# Patient Record
Sex: Male | Born: 1985
Health system: Southern US, Community
[De-identification: ages and names within clinical notes are randomized; demographics above are authoritative.]

## PROBLEM LIST (undated history)

## (undated) DIAGNOSIS — T7840XA Allergy, unspecified, initial encounter: Secondary | ICD-10-CM

## (undated) HISTORY — DX: Allergy, unspecified, initial encounter: T78.40XA

---
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
## 2006-08-20 ENCOUNTER — Ambulatory Visit (HOSPITAL_COMMUNITY): Admission: RE | Admit: 2006-08-20 | Discharge: 2006-08-20 | Payer: Self-pay

## 2016-05-21 ENCOUNTER — Ambulatory Visit (INDEPENDENT_AMBULATORY_CARE_PROVIDER_SITE_OTHER): Payer: BLUE CROSS/BLUE SHIELD | Admitting: Physician Assistant

## 2016-05-21 VITALS — BP 120/80 | HR 81 | Temp 97.8°F | Resp 18 | Ht 70.0 in | Wt 249.6 lb

## 2016-05-21 DIAGNOSIS — T63514A Toxic effect of contact with stingray, undetermined, initial encounter: Secondary | ICD-10-CM | POA: Diagnosis not present

## 2016-05-21 DIAGNOSIS — R229 Localized swelling, mass and lump, unspecified: Secondary | ICD-10-CM | POA: Diagnosis not present

## 2016-05-21 DIAGNOSIS — L299 Pruritus, unspecified: Secondary | ICD-10-CM | POA: Diagnosis not present

## 2016-05-21 DIAGNOSIS — T148XXA Other injury of unspecified body region, initial encounter: Secondary | ICD-10-CM

## 2016-05-21 MED ORDER — CEPHALEXIN 500 MG PO CAPS: 500.0000 mg | ORAL_CAPSULE | Freq: Four times a day (QID) | ORAL | 0 refills | Status: DC

## 2016-05-21 MED ORDER — HYDROXYZINE HCL 10 MG PO TABS: 10.0000 mg | ORAL_TABLET | Freq: Three times a day (TID) | ORAL | 0 refills | Status: DC | PRN

## 2016-05-21 NOTE — Patient Instructions (Addendum)
If you are getting worse tomorrow call and I will add doxycycline.     IF you received an x-ray today, you will receive an invoice from [facility]. Please contact [facility] at [phone] with questions or concerns regarding your invoice.   IF you received labwork today, you will receive an invoice from [facility]. Please contact [facility] at [phone] with questions or concerns regarding your invoice.   Our billing staff will not be able to assist you with questions regarding bills from these companies.  You will be contacted with the lab results as soon as they are available. The fastest way to get your results is to activate your My Chart account. Instructions are located on the last page of this paperwork. If you have not heard from us regarding the results in 2 weeks, please contact this office.

## 2016-05-21 NOTE — Progress Notes (Signed)
05/22/2016 11:23 AM   DOB: 1985-10-20 / MRN: [id number]  SUBJECTIVE:  Terry Curtis is a 30 y.o. male presenting for a wound to the left hand that began about 2 weeks ago when he was stung by a sting ray.  He presented [facility] where he received a negative rad of the left hand, cipro, and opioid pain medication. He was compliant with ABX therapy and ran out of Cipro about two days ago.  Over the last 24 hours the hand has become diffusely swollen and tender.  He denies fever, chills, nausea.  He has no allergies to antibiotics.    He is allergic to tramadol.   He  has a past medical history of Allergy.    He  reports that he has never smoked. He has never used smokeless tobacco. He reports that he drinks alcohol. He reports that he does not use drugs. He  has no sexual activity history on file. The patient  has no past surgical history on file.  His family history includes Diabetes in his mother.  Review of Systems  Constitutional: Negative for chills and fever.  Cardiovascular: Negative for chest pain.  Gastrointestinal: Negative for nausea.  Skin: Negative for itching and rash.  Neurological: Negative for dizziness and headaches.  Psychiatric/Behavioral: Negative for depression.    The problem list and medications were reviewed and updated by myself where necessary and exist elsewhere in the encounter.   OBJECTIVE:  BP 120/80   Pulse 81   Temp 97.8 F (36.6 C) (Oral)   Resp 18   Ht 5\' 10"  (1.778 m)   Wt 249 lb 9.6 oz (113.2 kg)   SpO2 96%   BMI 35.81 kg/m   Physical Exam  Constitutional: He is oriented to person, place, and time.  Cardiovascular: Normal rate and regular rhythm.   Pulmonary/Chest: Effort normal and breath sounds normal.  Abdominal: Soft. Bowel sounds are normal.  Musculoskeletal: Normal range of motion.  Neurological: He is alert and oriented to person, place, and time. No cranial nerve deficit.  Skin: Skin  is warm and dry.     Psychiatric: He has a normal mood and affect.        No results found for this or any previous visit (from the past 72 hour(s)).  No results found.  ASSESSMENT AND PLAN  Terry Curtis was seen today for other.  Diagnoses and all orders for this visit:  Localized soft tissue swelling: This looks like a secondary bacterial infection.  Will start keflex to cover for typical staph and strep and if he continues to worsen in 24 hours will add on doxy to cover for vibrio species.  He will call tomorrow if he is not improving.  I don't think a white count will change the plan today.  -     WOUND CULTURE  Bite wound -     WOUND CULTURE -     cephALEXin (KEFLEX) 500 MG capsule; Take 1 capsule (500 mg total) by mouth 4 (four) times daily.  Itching -     hydrOXYzine (ATARAX/VISTARIL) 10 MG tablet; Take 1 tablet (10 mg total) by mouth 3 (three) times daily as needed for itching.    The patient is advised to call or return to clinic if he does not see an improvement in symptoms, or to seek the care of the closest emergency department if he worsens with the above plan.   Deliah BostonMichael Sameka Bagent, MHS, PA-C  [facility] 05/22/2016 11:23 AM

## 2016-05-22 ENCOUNTER — Telehealth: Payer: Self-pay

## 2016-05-22 NOTE — Telephone Encounter (Signed)
Pt states the hand is not any better and was told to call back today if not a change from yesterday   Best number [phone]

## 2016-05-23 NOTE — Telephone Encounter (Signed)
The patient is advised to call or return to clinic if he does not see an improvement in symptoms, or to seek the care of the closest emergency department if he worsens with the above plan.   Deliah BostonMichael Clark, MHS, PA-C [facility] 05/22/2016 11:23 AM

## 2016-05-23 NOTE — Telephone Encounter (Signed)
Hi Terry Curtis. Please call in doxycycline 100 mg bid for ten days to puts pharmacy asap. Lease call pt and advise he continue keflex and add the doxy. I need to see him back on Friday.   Deliah BostonMichael Jaycee Mckellips

## 2016-05-24 MED ORDER — DOXYCYCLINE HYCLATE 100 MG PO TABS: 100.0000 mg | ORAL_TABLET | Freq: Two times a day (BID) | ORAL | 0 refills | Status: DC

## 2016-05-24 NOTE — Telephone Encounter (Signed)
Pt advised and Rx sent in.

## 2016-05-25 ENCOUNTER — Ambulatory Visit (INDEPENDENT_AMBULATORY_CARE_PROVIDER_SITE_OTHER): Payer: BLUE CROSS/BLUE SHIELD | Admitting: Physician Assistant

## 2016-05-25 VITALS — BP 148/92 | HR 83 | Temp 98.0°F | Resp 16 | Ht 70.0 in | Wt 246.8 lb

## 2016-05-25 DIAGNOSIS — R229 Localized swelling, mass and lump, unspecified: Secondary | ICD-10-CM | POA: Diagnosis not present

## 2016-05-25 DIAGNOSIS — T63514D Toxic effect of contact with stingray, undetermined, subsequent encounter: Secondary | ICD-10-CM | POA: Diagnosis not present

## 2016-05-25 DIAGNOSIS — T148XXA Other injury of unspecified body region, initial encounter: Secondary | ICD-10-CM

## 2016-05-25 LAB — WOUND CULTURE
Gram Stain: NONE SEEN
Gram Stain: NONE SEEN
Organism ID, Bacteria: NO GROWTH

## 2016-05-25 NOTE — Patient Instructions (Signed)
     IF you received an x-ray today, you will receive an invoice from [facility]. Please contact [facility] at [phone] with questions or concerns regarding your invoice.   IF you received labwork today, you will receive an invoice from [facility]. Please contact [facility] at [phone] with questions or concerns regarding your invoice.   Our billing staff will not be able to assist you with questions regarding bills from these companies.  You will be contacted with the lab results as soon as they are available. The fastest way to get your results is to activate your My Chart account. Instructions are located on the last page of this paperwork. If you have not heard from us regarding the results in 2 weeks, please contact this office.      

## 2016-05-25 NOTE — Progress Notes (Signed)
   05/25/2016 4:14 PM   DOB: 09/01/1986 / MRN: [id number]  SUBJECTIVE:  Terry Curtis is a 30 y.o. male presenting for a recheck of a left hand wound. See my last note for past details.  He is feeling much better as of yesterday.  Taking Keflex QID without complication. He continues to have some throbbing pain about the wound.  His culture came back positive for staph, but the culture failed to grow enough bacteria for sensitivities. He denies fever, chills, nausea.    He is allergic to tramadol.   He  has a past medical history of Allergy.    He  reports that he has never smoked. He has never used smokeless tobacco. He reports that he drinks alcohol. He reports that he does not use drugs. He  has no sexual activity history on file. The patient  has no past surgical history on file.  His family history includes Diabetes in his mother.  Review of Systems  Cardiovascular: Negative for chest pain.  Gastrointestinal: Negative for nausea.  Musculoskeletal: Negative for joint pain.  Skin: Negative for rash.  Neurological: Negative for headaches.    The problem list and medications were reviewed and updated by myself where necessary and exist elsewhere in the encounter.   OBJECTIVE:  BP (!) 148/92 (BP Location: Right Arm, Patient Position: Sitting, Cuff Size: Large)   Pulse 83   Temp 98 F (36.7 C) (Oral)   Resp 16   Ht 5\' 10"  (1.778 m)   Wt 246 lb 12.8 oz (111.9 kg)   SpO2 98%   BMI 35.41 kg/m    Physical Exam  Cardiovascular: Normal rate.   Pulmonary/Chest: Effort normal.  Musculoskeletal:       Hands:       No results found for this or any previous visit (from the past 72 hour(s)).  No results found.  ASSESSMENT AND PLAN  [name] was seen today for other.  Diagnoses and all orders for this visit:  Localized soft tissue swelling: I added doxy to his regimen a few days ago because he was complaining increasing redness and pain.  He has not filled this yet.  He is  improving but I would expect his improvement to be somewhat faster.  I have advised he fill the doxy and continue on keflex.  I have advised that if he is not 70% better in the next week or so to call and I will refer him to hand.   Bite wound: See above.     The patient is advised to call or return to clinic if he does not see an improvement in symptoms, or to seek the care of the closest emergency department if he worsens with the above plan.   Deliah BostonMichael Lexx Monte, MHS, PA-C [facility] 05/25/2016 4:14 PM

## 2016-05-28 ENCOUNTER — Telehealth: Payer: Self-pay

## 2016-05-28 NOTE — Telephone Encounter (Signed)
Note faxed.

## 2016-05-28 NOTE — Telephone Encounter (Signed)
Pt forgot to get note for work on Friday.  He left work to come in at 3:30 pm   He wants us to fax the note to [phone] Attention Evelina BucyKacey Sherril.  His number if needed is [phone]

## 2016-05-30 ENCOUNTER — Telehealth: Payer: Self-pay

## 2016-05-30 ENCOUNTER — Other Ambulatory Visit: Payer: Self-pay | Admitting: Physician Assistant

## 2016-05-30 DIAGNOSIS — L089 Local infection of the skin and subcutaneous tissue, unspecified: Secondary | ICD-10-CM

## 2016-05-30 DIAGNOSIS — S60519A Abrasion of unspecified hand, initial encounter: Principal | ICD-10-CM

## 2016-05-30 NOTE — Telephone Encounter (Signed)
I need to get him into hand.  Will place a referral today and will try to get him in as quickly as possible. Please call him and let him know. I have attached Noreene LarssonJill to this and she will follow up with referrals tomorrow morning as well. Deliah BostonMichael Brizeyda Holtmeyer, MS, PA-C 5:06 PM, 05/30/2016

## 2016-05-30 NOTE — Telephone Encounter (Signed)
Patient stated his left hand is not getting better. Patient stated he have blisters on his hand. He want to know if he need to come in and be seen. Patient stated he saw Deliah BostonMichael Clark. [phone].

## 2016-05-30 NOTE — Progress Notes (Signed)
f °

## 2016-06-01 ENCOUNTER — Encounter (HOSPITAL_COMMUNITY): Payer: Self-pay

## 2016-06-01 ENCOUNTER — Emergency Department (HOSPITAL_COMMUNITY)
Admission: EM | Admit: 2016-06-01 | Discharge: 2016-06-01 | Disposition: A | Discharge status: Home / Self Care | Payer: BLUE CROSS/BLUE SHIELD | Attending: Emergency Medicine | Admitting: Emergency Medicine

## 2016-06-01 ENCOUNTER — Emergency Department (HOSPITAL_COMMUNITY): Payer: BLUE CROSS/BLUE SHIELD

## 2016-06-01 DIAGNOSIS — L0291 Cutaneous abscess, unspecified: Secondary | ICD-10-CM

## 2016-06-01 DIAGNOSIS — L03114 Cellulitis of left upper limb: Secondary | ICD-10-CM | POA: Diagnosis present

## 2016-06-01 DIAGNOSIS — L02512 Cutaneous abscess of left hand: Secondary | ICD-10-CM | POA: Diagnosis not present

## 2016-06-01 LAB — CBC WITH DIFFERENTIAL/PLATELET
BASOS ABS: 0.1 10*3/uL (ref 0.0–0.1)
BASOS PCT: 1 %
Eosinophils Absolute: 0.3 10*3/uL (ref 0.0–0.7)
Eosinophils Relative: 4 %
HEMATOCRIT: 46.9 % (ref 39.0–52.0)
HEMOGLOBIN: 16.3 g/dL (ref 13.0–17.0)
LYMPHS PCT: 36 %
Lymphs Abs: 3.5 10*3/uL (ref 0.7–4.0)
MCH: 30.1 pg (ref 26.0–34.0)
MCHC: 34.8 g/dL (ref 30.0–36.0)
MCV: 86.7 fL (ref 78.0–100.0)
MONO ABS: 1.1 10*3/uL — AB (ref 0.1–1.0)
MONOS PCT: 11 %
NEUTROS ABS: 4.7 10*3/uL (ref 1.7–7.7)
NEUTROS PCT: 48 %
Platelets: 261 10*3/uL (ref 150–400)
RBC: 5.41 MIL/uL (ref 4.22–5.81)
RDW: 12.2 % (ref 11.5–15.5)
WBC: 9.6 10*3/uL (ref 4.0–10.5)

## 2016-06-01 LAB — I-STAT CG4 LACTIC ACID, ED
LACTIC ACID, VENOUS: 0.63 mmol/L (ref 0.5–1.9)
Lactic Acid, Venous: 1.24 mmol/L (ref 0.5–1.9)

## 2016-06-01 LAB — COMPREHENSIVE METABOLIC PANEL
ALK PHOS: 76 U/L (ref 38–126)
ALT: 69 U/L — AB (ref 17–63)
ANION GAP: 7 (ref 5–15)
AST: 30 U/L (ref 15–41)
Albumin: 4.5 g/dL (ref 3.5–5.0)
BILIRUBIN TOTAL: 0.7 mg/dL (ref 0.3–1.2)
BUN: 12 mg/dL (ref 6–20)
CALCIUM: 9.9 mg/dL (ref 8.9–10.3)
CO2: 26 mmol/L (ref 22–32)
Chloride: 106 mmol/L (ref 101–111)
Creatinine, Ser: 1.12 mg/dL (ref 0.61–1.24)
GLUCOSE: 91 mg/dL (ref 65–99)
POTASSIUM: 3.6 mmol/L (ref 3.5–5.1)
Sodium: 139 mmol/L (ref 135–145)
Total Protein: 6.7 g/dL (ref 6.5–8.1)

## 2016-06-01 MED ORDER — OXYCODONE-ACETAMINOPHEN 5-325 MG PO TABS
1.0000 | ORAL_TABLET | ORAL | Status: DC | PRN
  Administered 2016-06-01: 1 via ORAL

## 2016-06-01 MED ORDER — HYDROCODONE-ACETAMINOPHEN 5-325 MG PO TABS: 1.0000 | ORAL_TABLET | Freq: Four times a day (QID) | ORAL | 0 refills | Status: DC | PRN

## 2016-06-01 MED ORDER — LIDOCAINE HCL (PF) 2 % IJ SOLN: 0.0000 mL | Freq: Once | INTRAMUSCULAR | Status: DC | PRN

## 2016-06-01 MED ORDER — OXYCODONE-ACETAMINOPHEN 5-325 MG PO TABS
ORAL_TABLET | ORAL | Status: AC
  Filled 2016-06-01: qty 1

## 2016-06-01 MED ORDER — FLUCONAZOLE 100 MG PO TABS
150.0000 mg | ORAL_TABLET | Freq: Once | ORAL | Status: AC
  Administered 2016-06-01: 150 mg via ORAL
  Filled 2016-06-01: qty 2

## 2016-06-01 MED ORDER — SODIUM BICARBONATE 4 % IV SOLN
5.0000 mL | Freq: Once | INTRAVENOUS | Status: DC
  Filled 2016-06-01: qty 5

## 2016-06-01 MED ORDER — LIDOCAINE-EPINEPHRINE 2 %-1:200000 IJ SOLN
20.0000 mL | Freq: Once | INTRAMUSCULAR | Status: AC
Start: 2016-06-01 — End: 2016-06-01
  Administered 2016-06-01: 20 mL via INTRADERMAL
  Filled 2016-06-01: qty 20

## 2016-06-01 NOTE — Consult Note (Signed)
Reason for Consult:l hand swelling and pain Referring Physician: Criag Wicklund is an 30 y.o. male.  HPI: s/p sting ray barb injury 3 weeks ago with worsening of pain and swelling despite po abx  Past Medical History:  Diagnosis Date  . Allergy     History reviewed. No pertinent surgical history.  Family History  Problem Relation Age of Onset  . Diabetes Mother     Social History:  reports that he has never smoked. He has never used smokeless tobacco. He reports that he drinks alcohol. He reports that he does not use drugs.  Allergies:  Allergies  Allergen Reactions  . Tramadol Nausea Only  . Adhesive [Tape] Rash    3X3 adhesive bandage caused rash    Medications:  Scheduled: . sodium bicarbonate  5 mL Intravenous Once    Results for orders placed or performed during the hospital encounter of 06/01/16 (from the past 48 hour(s))  Comprehensive metabolic panel     Status: Abnormal   Collection Time: 06/01/16  2:36 PM  Result Value Ref Range   Sodium 139 135 - 145 mmol/L   Potassium 3.6 3.5 - 5.1 mmol/L   Chloride 106 101 - 111 mmol/L   CO2 26 22 - 32 mmol/L   Glucose, Bld 91 65 - 99 mg/dL   BUN 12 6 - 20 mg/dL   Creatinine, Ser 1.12 0.61 - 1.24 mg/dL   Calcium 9.9 8.9 - 10.3 mg/dL   Total Protein 6.7 6.5 - 8.1 g/dL   Albumin 4.5 3.5 - 5.0 g/dL   AST 30 15 - 41 U/L   ALT 69 (H) 17 - 63 U/L   Alkaline Phosphatase 76 38 - 126 U/L   Total Bilirubin 0.7 0.3 - 1.2 mg/dL   GFR calc non Af Amer >60 >60 mL/min   GFR calc Af Amer >60 >60 mL/min    Comment: (NOTE) The eGFR has been calculated using the CKD EPI equation. This calculation has not been validated in all clinical situations. eGFR's persistently <60 mL/min signify possible Chronic Kidney Disease.    Anion gap 7 5 - 15  CBC with Differential     Status: Abnormal   Collection Time: 06/01/16  2:36 PM  Result Value Ref Range   WBC 9.6 4.0 - 10.5 K/uL   RBC 5.41 4.22 - 5.81 MIL/uL   Hemoglobin 16.3  13.0 - 17.0 g/dL   HCT 46.9 39.0 - 52.0 %   MCV 86.7 78.0 - 100.0 fL   MCH 30.1 26.0 - 34.0 pg   MCHC 34.8 30.0 - 36.0 g/dL   RDW 12.2 11.5 - 15.5 %   Platelets 261 150 - 400 K/uL   Neutrophils Relative % 48 %   Neutro Abs 4.7 1.7 - 7.7 K/uL   Lymphocytes Relative 36 %   Lymphs Abs 3.5 0.7 - 4.0 K/uL   Monocytes Relative 11 %   Monocytes Absolute 1.1 (H) 0.1 - 1.0 K/uL   Eosinophils Relative 4 %   Eosinophils Absolute 0.3 0.0 - 0.7 K/uL   Basophils Relative 1 %   Basophils Absolute 0.1 0.0 - 0.1 K/uL  I-Stat CG4 Lactic Acid, ED     Status: None   Collection Time: 06/01/16  3:18 PM  Result Value Ref Range   Lactic Acid, Venous 1.24 0.5 - 1.9 mmol/L  I-Stat CG4 Lactic Acid, ED     Status: None   Collection Time: 06/01/16  9:03 PM  Result Value Ref Range  Lactic Acid, Venous 0.63 0.5 - 1.9 mmol/L    Dg Hand Complete Left  Result Date: 06/01/2016 CLINICAL DATA:  Infection. Patient reports being stung by a sting ray 20 days prior, without improvement. Edema about the third, fourth, and fifth digits. EXAM: LEFT HAND - COMPLETE 3+ VIEW COMPARISON:  None. FINDINGS: There is no evidence of fracture or dislocation. There is no evidence of arthropathy or other focal bone abnormality. No bony destructive change. There is dorsal soft tissue edema with skin irregularity. No radiopaque foreign body. No soft tissue air. IMPRESSION: Dorsal soft tissue edema. No soft tissue air or radiopaque foreign body. No osseous abnormality to suggest osteomyelitis. Electronically Signed   By: Jeb Levering M.D.   On: 06/01/2016 22:06    Review of Systems  All other systems reviewed and are negative.  Blood pressure 126/83, pulse 60, temperature 98.3 F (36.8 C), temperature source Oral, resp. rate 16, height _0  (1.778 m), weight 109.8 kg (242 lb), SpO2 97 %. Physical Exam  Constitutional: He is oriented to person, place, and time. He appears well-developed and well-nourished.  HENT:  Head:  Normocephalic and atraumatic.  Neck: Normal range of motion.  Cardiovascular: Normal rate.   Respiratory: Effort normal.  Musculoskeletal:       Left hand: He exhibits tenderness, laceration and swelling.  Left hand dorsal swelling and induration over long metacarpal  Neurological: He is alert and oriented to person, place, and time.  Skin: Skin is warm. There is erythema.    Assessment/Plan: As above    Bedside I and D performed and wound packed with iodoform  Will see in my office this Monday for dressing change  Er staff to manage pain  Will finish current abx  Terry Curtis A 06/01/2016, 10:51 PM

## 2016-06-01 NOTE — ED Notes (Signed)
Puncture would to top of left hand. Not warm to touch. Tender to palpation.

## 2016-06-01 NOTE — ED Triage Notes (Signed)
Pt presents for evaluation of cellulitis/abcess to L hand. Pt. States was stung by stingray approx 3 weeks ago. [facility] Pt. Has completed keflex and cipro. Currently taking doxycycline. Pt. Reports Wednesday noted that area on L hand began to swell and spread. Afebrile at home.

## 2016-06-01 NOTE — ED Provider Notes (Signed)
[facility]-EMERGENCY DEPT Provider Note   CSN: [id number] Arrival date & time: 06/01/16  1424     History   Chief Complaint Chief Complaint  Patient presents with  . Cellulitis    HPI Terry Curtis is a 30 y.o. male.  HPI  Patient is a 30 year old male who presents to the emergency department with complaint of worsening infection on the top of his left hand after being stung by a stingray 20 days ago. He has taken a course of Cipro, Keflex and is currently on doxycycline. He has a managed by his PCP and was referred to a hand surgeon they did not think it was appropriate for them to be seen in their office and stated if it was worsening he would need to go to the ER. He presents to the ER with worsening local swelling with redness, pain and severe persistent itching. No relief with hydroxyzine.      Past Medical History:  Diagnosis Date  . Allergy     There are no active problems to display for this patient.   History reviewed. No pertinent surgical history.     Home Medications    Prior to Admission medications   Medication Sig Start Date End Date Taking? Authorizing Provider  doxycycline (VIBRA-TABS) 100 MG tablet Take 1 tablet (100 mg total) by mouth 2 (two) times daily. Patient taking differently: Take 100 mg by mouth 2 (two) times daily. 10 day course started 05/25/16 pm 05/24/16  Yes Ofilia NeasMichael L Clark, PA-C  fexofenadine (ALLEGRA) 180 MG tablet Take 180 mg by mouth daily.   Yes Historical Provider, MD  fluticasone (FLONASE) 50 MCG/ACT nasal spray Place 1 spray into both nostrils daily.   Yes Historical Provider, MD  hydrOXYzine (ATARAX/VISTARIL) 10 MG tablet Take 1 tablet (10 mg total) by mouth 3 (three) times daily as needed for itching. Patient taking differently: Take 10 mg by mouth at bedtime as needed for itching.  05/21/16  Yes Ofilia NeasMichael L Clark, PA-C  ibuprofen (ADVIL,MOTRIN) 200 MG tablet Take 400 mg by mouth every 6 (six) hours as needed.   Yes Historical  Provider, MD  HYDROcodone-acetaminophen (NORCO/VICODIN) 5-325 MG tablet Take 1-2 tablets by mouth every 6 (six) hours as needed for severe pain. 06/01/16   Danelle BerryLeisa Juanluis Guastella, PA-C    Family History Family History  Problem Relation Age of Onset  . Diabetes Mother     Social History Social History  Substance Use Topics  . Smoking status: Never Smoker  . Smokeless tobacco: Never Used  . Alcohol use Yes     Allergies   Tramadol and Adhesive [tape]   Review of Systems Review of Systems  All other systems reviewed and are negative.    Physical Exam Updated Vital Signs BP 126/83 (BP Location: Right Arm)   Pulse 60   Temp 98.3 F (36.8 C) (Oral)   Resp 16   Ht 5\' 10"  (1.778 m)   Wt 109.8 kg   SpO2 97%   BMI 34.72 kg/m   Physical Exam  Constitutional: He is oriented to person, place, and time. He appears well-developed and well-nourished. No distress.  HENT:  Head: Normocephalic and atraumatic.  Right Ear: External ear normal.  Left Ear: External ear normal.  Nose: Nose normal.  Mouth/Throat: Oropharynx is clear and moist.  Eyes: Conjunctivae and EOM are normal. Pupils are equal, round, and reactive to light. Right eye exhibits no discharge. Left eye exhibits no discharge. No scleral icterus.  Neck: Normal range of motion.  Neck supple.  Cardiovascular: Normal rate and regular rhythm.   Pulmonary/Chest: Effort normal and breath sounds normal. No stridor. No respiratory distress.  Musculoskeletal: Normal range of motion. He exhibits no edema.  Lymphadenopathy:    He has no cervical adenopathy.  Neurological: He is alert and oriented to person, place, and time. He exhibits normal muscle tone. Coordination normal.  Skin: Skin is warm and dry. Rash noted. He is not diaphoretic. There is erythema. No pallor.  6 cm circular area of erythema and fluctuance, ttp, on dorsum of left hand over mid to distal third and fourth metacarpals  Psychiatric: He has a normal mood and affect.  His behavior is normal. Judgment and thought content normal.  Nursing note and vitals reviewed.    ED Treatments / Results  Labs (all labs ordered are listed, but only abnormal results are displayed) Labs Reviewed  COMPREHENSIVE METABOLIC PANEL - Abnormal; Notable for the following:       Result Value   ALT 69 (*)    All other components within normal limits  CBC WITH DIFFERENTIAL/PLATELET - Abnormal; Notable for the following:    Monocytes Absolute 1.1 (*)    All other components within normal limits  I-STAT CG4 LACTIC ACID, ED  I-STAT CG4 LACTIC ACID, ED    EKG  EKG Interpretation None       Radiology Dg Hand Complete Left  Result Date: 06/01/2016 CLINICAL DATA:  Infection. Patient reports being stung by a sting ray 20 days prior, without improvement. Edema about the third, fourth, and fifth digits. EXAM: LEFT HAND - COMPLETE 3+ VIEW COMPARISON:  None. FINDINGS: There is no evidence of fracture or dislocation. There is no evidence of arthropathy or other focal bone abnormality. No bony destructive change. There is dorsal soft tissue edema with skin irregularity. No radiopaque foreign body. No soft tissue air. IMPRESSION: Dorsal soft tissue edema. No soft tissue air or radiopaque foreign body. No osseous abnormality to suggest osteomyelitis. Electronically Signed   By: Rubye Oaks M.D.   On: 06/01/2016 22:06    Procedures Procedures (including critical care time)  Medications Ordered in ED Medications  oxyCODONE-acetaminophen (PERCOCET/ROXICET) 5-325 MG per tablet 1 tablet (1 tablet Oral Given 06/01/16 1436)  sodium bicarbonate (NEUT) 4 % injection 5 mL (not administered)  fluconazole (DIFLUCAN) tablet 150 mg (150 mg Oral Given 06/01/16 2119)  lidocaine-EPINEPHrine (XYLOCAINE W/EPI) 2 %-1:200000 (PF) injection 20 mL (20 mLs Intradermal Given 06/01/16 2211)     Initial Impression / Assessment and Plan / ED Course  I have reviewed the triage vital signs and the nursing  notes.  Pertinent labs & imaging results that were available during my care of the patient were reviewed by me and considered in my medical decision making (see chart for details).  Clinical Course   Left dorsal hand infection, persists after several Abx, small area of fluctuance, initial injury with stingray injury. Labs resulted prior to my evaluation, no leukocytosis, lactic acid negative Clinically consistent with abscess with localize erythema and fluctuance.  Xray negative for FB, osteomyelitis  Dr. Mina Marble consulted, I&D's at bedside, packed, to follow up in clinic on Monday. Pain medication and work note provided.  Discharged in good condition, understands follow up, wound care, and return precautions.  Final Clinical Impressions(s) / ED Diagnoses   Final diagnoses:  Abscess    New Prescriptions New Prescriptions   HYDROCODONE-ACETAMINOPHEN (NORCO/VICODIN) 5-325 MG TABLET    Take 1-2 tablets by mouth every 6 (six) hours as needed  for severe pain.     Danelle Berry, PA-C 06/01/16 2258    Linwood Dibbles, MD 06/02/16 [phone]

## 2016-08-13 ENCOUNTER — Ambulatory Visit (INDEPENDENT_AMBULATORY_CARE_PROVIDER_SITE_OTHER): Payer: BLUE CROSS/BLUE SHIELD

## 2016-08-13 ENCOUNTER — Ambulatory Visit (INDEPENDENT_AMBULATORY_CARE_PROVIDER_SITE_OTHER): Payer: BLUE CROSS/BLUE SHIELD | Admitting: Physician Assistant

## 2016-08-13 VITALS — BP 140/90 | HR 103 | Temp 98.2°F | Resp 17 | Ht 70.0 in | Wt 245.0 lb

## 2016-08-13 DIAGNOSIS — M79674 Pain in right toe(s): Secondary | ICD-10-CM

## 2016-08-13 LAB — CBC
HEMATOCRIT: 46.6 % (ref 38.5–50.0)
HEMOGLOBIN: 16.5 g/dL (ref 13.2–17.1)
MCH: 30.4 pg (ref 27.0–33.0)
MCHC: 35.4 g/dL (ref 32.0–36.0)
MCV: 86 fL (ref 80.0–100.0)
MPV: 9.4 fL (ref 7.5–12.5)
Platelets: 248 10*3/uL (ref 140–400)
RBC: 5.42 MIL/uL (ref 4.20–5.80)
RDW: 12.8 % (ref 11.0–15.0)
WBC: 9.4 10*3/uL (ref 3.8–10.8)

## 2016-08-13 MED ORDER — INDOMETHACIN 50 MG PO CAPS: 50.0000 mg | ORAL_CAPSULE | Freq: Three times a day (TID) | ORAL | 1 refills | Status: DC

## 2016-08-13 MED ORDER — KETOROLAC TROMETHAMINE 60 MG/2ML IM SOLN
60.0000 mg | Freq: Once | INTRAMUSCULAR | Status: AC
  Administered 2016-08-13: 60 mg via INTRAMUSCULAR

## 2016-08-13 NOTE — Patient Instructions (Addendum)
Drink copious fluids.     IF you received an x-ray today, you will receive an invoice from [facility]. Please contact [facility] at [phone] with questions or concerns regarding your invoice.   IF you received labwork today, you will receive an invoice from [facility]. Please contact [facility] at [phone] with questions or concerns regarding your invoice.   Our billing staff will not be able to assist you with questions regarding bills from these companies.  You will be contacted with the lab results as soon as they are available. The fastest way to get your results is to activate your My Chart account. Instructions are located on the last page of this paperwork. If you have not heard from us regarding the results in 2 weeks, please contact this office.

## 2016-08-13 NOTE — Progress Notes (Signed)
08/13/2016 2:46 PM   DOB: 11/12/1985 / MRN: [id number]  SUBJECTIVE:  Terry Curtis is a 30 y.o. male presenting for right sided two pain that started three days ago.  Complains of pain with ambulation and tenderness with "letting the sheet touch the toe" at night.  Feels that it is also warm.  Has been walking on the outside of his foot.  No shellfish, redmeats, binge drinking in the last week.  Feels he is getting worse.  Has tried hydrocodone with good relief however the pain does return. Did recently start exercising.   He is allergic to tramadol and adhesive [tape].   He  has a past medical history of Allergy.    He  reports that he has never smoked. He has never used smokeless tobacco. He reports that he drinks alcohol. He reports that he does not use drugs. He  has no sexual activity history on file. The patient  has no past surgical history on file.  His family history includes Diabetes in his mother.  Review of Systems  Cardiovascular: Negative for chest pain and leg swelling.  Gastrointestinal: Negative for nausea.  Genitourinary: Negative for dysuria.  Musculoskeletal: Positive for joint pain. Negative for back pain and falls.  Skin: Positive for rash.    The problem list and medications were reviewed and updated by myself where necessary and exist elsewhere in the encounter.   OBJECTIVE:  BP 140/90 (BP Location: Right Arm, Patient Position: Sitting, Cuff Size: Large)   Pulse (!) 103   Temp 98.2 F (36.8 C) (Oral)   Resp 17   Ht 5\' 10"  (1.778 m)   Wt 245 lb (111.1 kg)   SpO2 96%   BMI 35.15 kg/m   Physical Exam  Constitutional: He appears well-developed and well-nourished. No distress.  Cardiovascular: Normal rate and regular rhythm.   Pulmonary/Chest: Effort normal and breath sounds normal.  Abdominal: Soft.  Musculoskeletal: Normal range of motion. He exhibits tenderness (right great MTP with mild swelling and redness of the skin. ).  Skin: Skin is warm  and dry. He is not diaphoretic.  Psychiatric: He has a normal mood and affect.    No results found for this or any previous visit (from the past 72 hour(s)).  Dg Foot Complete Right  Result Date: 08/13/2016 CLINICAL DATA:  30 year old male with history of pain in the first MTP joint for the past 3-4 days. EXAM: RIGHT FOOT COMPLETE - 3+ VIEW COMPARISON:  No priors. FINDINGS: There is no evidence of fracture or dislocation. There is no evidence of arthropathy or other focal bone abnormality. Soft tissues are unremarkable. IMPRESSION: Negative. Electronically Signed   By: Trudie Reedaniel  Entrikin M.D.   On: 08/13/2016 14:35    ASSESSMENT AND PLAN  Terry Curtis was seen today for toe pain.  Diagnoses and all orders for this visit:  Pain of right great toe: Rads negative.  This is likely gout.  Will run those labs and treat with indocin for now pending levels.  He is not taking any new medications.  I have advised that he push PO fluids.  -     DG Foot Complete Right; Future -     Creatinine with Est GFR -     CBC -     Uric Acid -     indomethacin (INDOCIN) 50 MG capsule; Take 1 capsule (50 mg total) by mouth 3 (three) times daily with meals.    The patient is advised to call or return  to clinic if he does not see an improvement in symptoms, or to seek the care of the closest emergency department if he worsens with the above plan.   Deliah BostonMichael Ramari Bray, MHS, PA-C [facility] 08/13/2016 2:46 PM

## 2016-08-14 LAB — CREATININE WITH EST GFR
CREATININE: 1.1 mg/dL (ref 0.60–1.35)
GFR, Est African American: >89 mL/min (ref >=60)
GFR, Est Non African American: 89 mL/min (ref >=60)

## 2016-08-14 LAB — URIC ACID: Uric Acid, Serum: 7.1 mg/dL (ref 4.0–8.0)

## 2016-10-04 ENCOUNTER — Ambulatory Visit (INDEPENDENT_AMBULATORY_CARE_PROVIDER_SITE_OTHER): Payer: BLUE CROSS/BLUE SHIELD | Admitting: Physician Assistant

## 2016-10-04 VITALS — BP 128/92 | HR 86 | Temp 98.5°F | Resp 16 | Ht 70.0 in | Wt 234.4 lb

## 2016-10-04 DIAGNOSIS — R000 Tachycardia, unspecified: Secondary | ICD-10-CM

## 2016-10-04 DIAGNOSIS — K529 Noninfective gastroenteritis and colitis, unspecified: Secondary | ICD-10-CM | POA: Diagnosis not present

## 2016-10-04 DIAGNOSIS — D72829 Elevated white blood cell count, unspecified: Secondary | ICD-10-CM

## 2016-10-04 LAB — POCT CBC
GRANULOCYTE PERCENT: 90.8 % — AB (ref 37–80)
HEMATOCRIT: 51 % (ref 43.5–53.7)
Hemoglobin: 18 g/dL (ref 14.1–18.1)
Lymph, poc: 1.4 (ref 0.6–3.4)
MCH: 30.9 pg (ref 27–31.2)
MCHC: 35.4 g/dL (ref 31.8–35.4)
MCV: 87.3 fL (ref 80–97)
MID (CBC): 0.1 (ref 0–0.9)
MPV: 6.9 fL (ref 0–99.8)
PLATELET COUNT, POC: 241 10*3/uL (ref 142–424)
POC Granulocyte: 14.4 — AB (ref 2–6.9)
POC LYMPH %: 8.6 % — AB (ref 10–50)
POC MID %: 0.6 % (ref 0–12)
RBC: 5.84 M/uL (ref 4.69–6.13)
RDW, POC: 12.8 %
WBC: 15.9 10*3/uL — AB (ref 4.6–10.2)

## 2016-10-04 NOTE — Patient Instructions (Addendum)
Continue to drink fluids. Start to advance diet as tolerated. If symptoms worsen or do not improve in 24 hours return to our clinic or to the ER. Start eating a BRAT diet (banana, rice, apple, toast) and advance diet as tolerated.  Thank you for letting me participate in your health and well being.   IF you received an x-ray today, you will receive an invoice from [facility]. Please contact [facility] at [phone] with questions or concerns regarding your invoice.   IF you received labwork today, you will receive an invoice from [facility]. Please contact [facility] at [phone] with questions or concerns regarding your invoice.   Our billing staff will not be able to assist you with questions regarding bills from these companies.   Dehydration, Adult Dehydration is when there is not enough fluid or water in your body. This happens when you lose more fluids than you take in. Dehydration can range from mild to very bad. It should be treated right away to keep it from getting very bad. Symptoms of mild dehydration may include:  Thirst.  Dry lips.  Slightly dry mouth.  Dry, warm skin.  Dizziness. Symptoms of moderate dehydration may include:  Very dry mouth.  Muscle cramps.  Dark pee (urine). Pee may be the color of tea.  Your body making less pee.  Your eyes making fewer tears.  Heartbeat that is uneven or faster than normal (palpitations).  Headache.  Light-headedness, especially when you stand up from sitting.  Fainting (syncope). Symptoms of very bad dehydration may include:  Changes in skin, such as:  Cold and clammy skin.  Blotchy (mottled) or pale skin.  Skin that does not quickly return to normal after being lightly pinched and let go (poor skin turgor).  Changes in body fluids, such as:  Feeling very thirsty.  Your eyes making fewer tears.  Not sweating when body temperature is high, such as in hot weather.  Your body making  very little pee.  Changes in vital signs, such as:  Weak pulse.  Pulse that is more than 100 beats a minute when you are sitting still.  Fast breathing.  Low blood pressure.  Other changes, such as:  Sunken eyes.  Cold hands and feet.  Confusion.  Lack of energy (lethargy).  Trouble waking up from sleep.  Short-term weight loss.  Unconsciousness. Follow these instructions at home:  If told by your doctor, drink an ORS:  Make an ORS by using instructions on the package.  Start by drinking small amounts, about  cup (120 mL) every 5-10 minutes.  Slowly drink more until you have had the amount that your doctor said to have.  Drink enough clear fluid to keep your pee clear or pale yellow. If you were told to drink an ORS, finish the ORS first, then start slowly drinking clear fluids. Drink fluids such as:  Water. Do not drink only water by itself. Doing that can make the salt (sodium) level in your body get too low (hyponatremia).  Ice chips.  Fruit juice that you have added water to (diluted).  Low-calorie sports drinks.  Avoid:  Alcohol.  Drinks that have a lot of sugar. These include high-calorie sports drinks, fruit juice that does not have water added, and soda.  Caffeine.  Foods that are greasy or have a lot of fat or sugar.  Take over-the-counter and prescription medicines only as told by your doctor.  Do not take salt tablets. Doing that can make the  salt level in your body get too high (hypernatremia).  Eat foods that have minerals (electrolytes). Examples include bananas, oranges, potatoes, tomatoes, and spinach.  Keep all follow-up visits as told by your doctor. This is important. Contact a doctor if:  You have belly (abdominal) pain that:  Gets worse.  Stays in one area (localizes).  You have a rash.  You have a stiff neck.  You get angry or annoyed more easily than normal (irritability).  You are more sleepy than normal.  You  have a harder time waking up than normal.  You feel:  Weak.  Dizzy.  Very thirsty.  You have peed (urinated) only a small amount of very dark pee during 6-8 hours. Get help right away if:  You have symptoms of very bad dehydration.  You cannot drink fluids without throwing up (vomiting).  Your symptoms get worse with treatment.  You have a fever.  You have a very bad headache.  You are throwing up or having watery poop (diarrhea) and it:  Gets worse.  Does not go away.  You have blood or something green (bile) in your throw-up.  You have blood in your poop (stool). This may cause poop to look black and tarry.  You have not peed in 6-8 hours.  You pass out (faint).  Your heart rate when you are sitting still is more than 100 beats a minute.  You have trouble breathing. This information is not intended to replace advice given to you by your health care provider. Make sure you discuss any questions you have with your health care provider. Document Released: 07/21/2009 Document Revised: 04/13/2016 Document Reviewed: 11/18/2015 Elsevier Interactive Patient Education  2017 [employer] You will be contacted with the lab results as soon as they are available. The fastest way to get your results is to activate your My Chart account. Instructions are located on the last page of this paperwork. If you have not heard from us regarding the results in 2 weeks, please contact this office.      Food Poisoning Introduction Food poisoning is an illness that is caused by eating or drinking contaminated foods or drinks. Food poisoning is usually mild and lasts 1-2 days. Foods and drinks can become contaminated because of:  Poor personal hygiene, such as not washing hands well enough or often.  Not storing food properly. For example, not refrigerating raw meat.  Serving, preparing, and storing food on surfaces that are not clean.  Cooking or eating with utensils that are not  clean. The most common causes of this condition are:  Viruses.  Bacteria.  Parasites. Follow these instructions at home: Eating and drinking  Drink enough fluids to keep your pee (urine) clear or pale yellow. You may need to drink small amounts of clear liquids often.  Avoid:  Milk.  Caffeine.  Alcohol.  Ask your doctor exactly how you should get enough fluid in your body (rehydrate).  Eat small meals often rather than eating large meals. Medicines  Take over-the-counter and prescription medicines only as told by your doctor. Ask your doctor if you should continue to take any of your regular prescribed and over-the-counter medicines.  If you were prescribed an antibiotic medicine, take it as told by your doctor. Do not stop taking the antibiotic even if you start to feel better. General instructions  Wash your hands fully before you prepare food and after you go to the bathroom (use the toilet). Make sure people who live with you  also wash their hands often.  Clean surfaces that you touch with a product that contains chlorine bleach.  Keep all follow-up visits as told by your doctor. This is important. How is this prevented?  Wash your hands, food preparation surfaces, and utensils before and after you handle raw foods.  Use separate food preparation surfaces and storage spaces for raw meat and for fruits and vegetables.  Keep refrigerated foods colder than 77F (5C).  Serve hot foods right away or keep them heated above 177F (60C).  Store dry foods in cool, dry spaces. Keep them away from too much heat or moisture.  Throw out any foods that:  Do not smell right.  Are in cans that are bulging.  Follow approved canning procedures.  Heat canned foods fully before you taste them.  Drink bottled or sterile water when you travel. Get help right away if: Call 911 or go to the emergency room if:  You have  trouble:  Breathing.  Swallowing.  Talking.  Moving.  You have blurry vision.  You cannot eat or drink without throwing up (vomiting).  You pass out (faint).  Your eyes turn yellow.  You continue to throw up or have watery poop (diarrhea).  You start to have pain in your belly (abdomen), your belly pain gets worse, or your belly pain stays in one small area.  You have a fever.  You have blood or mucus in your poop (stools), or your poop looks dark black and tarry.  You have signs of dehydration, such as:  Dark pee, very little pee, or no pee.  Cracked lips.  Not making tears while crying.  Dry mouth.  Sunken eyes.  Sleepiness.  Weakness.  Dizziness. This information is not intended to replace advice given to you by your health care provider. Make sure you discuss any questions you have with your health care provider. Document Released: 03/14/2010 Document Revised: 03/01/2016 Document Reviewed: 03/28/2015  2017 Elsevier

## 2016-10-04 NOTE — Progress Notes (Signed)
Terry PeyerJames Evan Baisch  MRN: [id number] DOB: [birth year]  Subjective:  Terry Curtis is a 30 y.o. male seen in office today for a chief complaint of vomiting and diarrhea x 9 hours. Started last night around 1am. Woke up and had diarrhea and vomiting. Has vomited and diarrhea every hour since then and now cannot even drink fluids. Last episode was one hour prior to arrival. Notes he ate a cheeseburger from bites and pints for dinner last night around 7-8 pm and on his last bite he noticed it was pink inside. No one else had a similar burger. Has associated abdominal cramping. Denies fever and chills. Has tried antidiarrheal medication but then vomited.   Review of Systems  Respiratory: Negative for cough and shortness of breath.   Cardiovascular: Positive for palpitations (feels his chest pounding). Negative for chest pain.  Gastrointestinal: Negative for abdominal distention, anal bleeding and blood in stool.   There are no active problems to display for this patient.   Current Outpatient Prescriptions on File Prior to Visit  Medication Sig Dispense Refill  . fexofenadine (ALLEGRA) 180 MG tablet Take 180 mg by mouth daily.    . fluticasone (FLONASE) 50 MCG/ACT nasal spray Place 1 spray into both nostrils daily.    Marland Kitchen. HYDROcodone-acetaminophen (NORCO/VICODIN) 5-325 MG tablet Take 1-2 tablets by mouth every 6 (six) hours as needed for severe pain. (Patient not taking: Reported on 10/04/2016) 20 tablet 0  . hydrOXYzine (ATARAX/VISTARIL) 10 MG tablet Take 1 tablet (10 mg total) by mouth 3 (three) times daily as needed for itching. (Patient not taking: Reported on 10/04/2016) 30 tablet 0  . indomethacin (INDOCIN) 50 MG capsule Take 1 capsule (50 mg total) by mouth 3 (three) times daily with meals. (Patient not taking: Reported on 10/04/2016) 30 capsule 1   No current facility-administered medications on file prior to visit.     Allergies  Allergen Reactions  . Tramadol Nausea Only  .  Adhesive [Tape] Rash    3X3 adhesive bandage caused rash     Objective:  BP (!) 128/92   Pulse (!) 120   Temp 98.5 F (36.9 C) (Oral)   Resp 16   Ht 5\' 10"  (1.778 m)   Wt 234 lb 6.4 oz (106.3 kg)   SpO2 96%   BMI 33.63 kg/m   Physical Exam  Constitutional: He is oriented to person, place, and time. He appears dehydrated.  Well developed, well nourished, appears uncomfortable lying on exam table.   HENT:  Head: Normocephalic and atraumatic.  Mouth/Throat: Mucous membranes are dry.  Eyes: Conjunctivae are normal.  Neck: Normal range of motion.  Cardiovascular: Regular rhythm and normal heart sounds.  Tachycardia present.   Pulmonary/Chest: Effort normal.  Abdominal: Soft. Normal appearance. Bowel sounds are hyperactive. There is generalized tenderness.  Neurological: He is alert and oriented to person, place, and time. Gait normal.  Skin: Skin is warm and dry.  Psychiatric: Affect normal.  Vitals reviewed.  Results for orders placed or performed in visit on 10/04/16 (from the past 24 hour(s))  POCT CBC     Status: Abnormal   Collection Time: 10/04/16 11:09 AM  Result Value Ref Range   WBC 15.9 (A) 4.6 - 10.2 K/uL   Lymph, poc 1.4 0.6 - 3.4   POC LYMPH PERCENT 8.6 (A) 10 - 50 %L   MID (cbc) 0.1 0 - 0.9   POC MID % 0.6 0 - 12 %M   POC Granulocyte 14.4 (A) 2 - 6.9  Granulocyte percent 90.8 (A) 37 - 80 %G   RBC 5.84 4.69 - 6.13 M/uL   Hemoglobin 18.0 14.1 - 18.1 g/dL   HCT, POC 16.151.0 09.643.5 - 53.7 %   MCV 87.3 80 - 97 fL   MCH, POC 30.9 27 - 31.2 pg   MCHC 35.4 31.8 - 35.4 g/dL   RDW, POC 04.512.8 %   Platelet Count, POC 241 142 - 424 K/uL   MPV 6.9 0 - 99.8 fL   Post IVF, pt notes he feels much better. His pulse has decreased and he notes his abdominal pain has lessened. He is sitting up on exam table, chatting in a cheerful mood. Pulse 86 bpm.   Assessment and Plan :  1. Tachycardia Resolved with IVF - Orthostatic vital signs - POCT CBC  2. Gastroenteritis Likely  from food poisoning. Pt feels much better with IVF. Instructed to continue oral hydration and BRAT diet, advance diet as tolerated.  -Return to clinic if symptoms worsen, do not improve in 24 hours, or as needed  3. Leukocytosis, unspecified type Likely in response to excessive diarrhea and vomiting. Instructed pt that if his abdominal pain begins to be more focal in nature to return to our clinic or the ER for further evaluation.   Benjiman CoreBrittany Raun Routh PA-C  [facility] 10/04/2016 11:37 AM

## 2016-12-22 DIAGNOSIS — H1089 Other conjunctivitis: Secondary | ICD-10-CM | POA: Diagnosis not present

## 2017-02-17 IMAGING — CR DG HAND COMPLETE 3+V*L*
3 series · 3 of 3 positions shown · non-contrast
Comparison: None.

CLINICAL DATA: Infection. Patient reports being stung by a sting
ray 20 days prior, without improvement. Edema about the third,
fourth, and fifth digits.

EXAM:
LEFT HAND - COMPLETE 3+ VIEW

[hand pa]
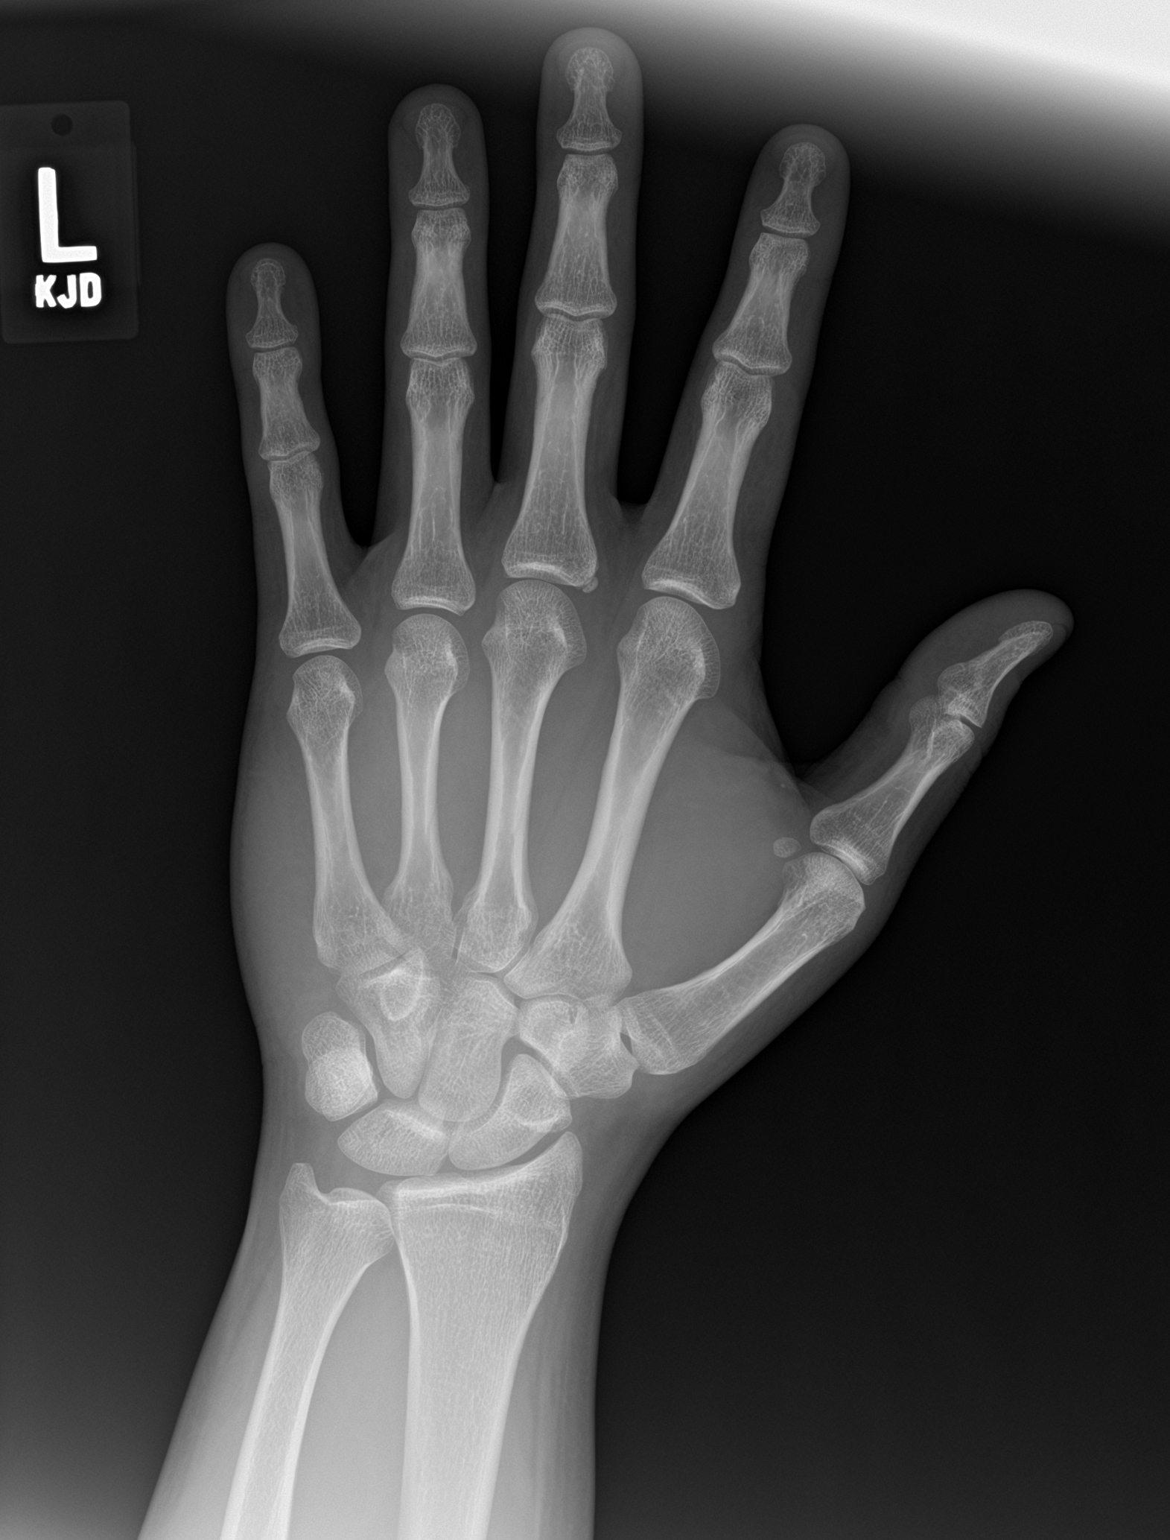

[hand obl]
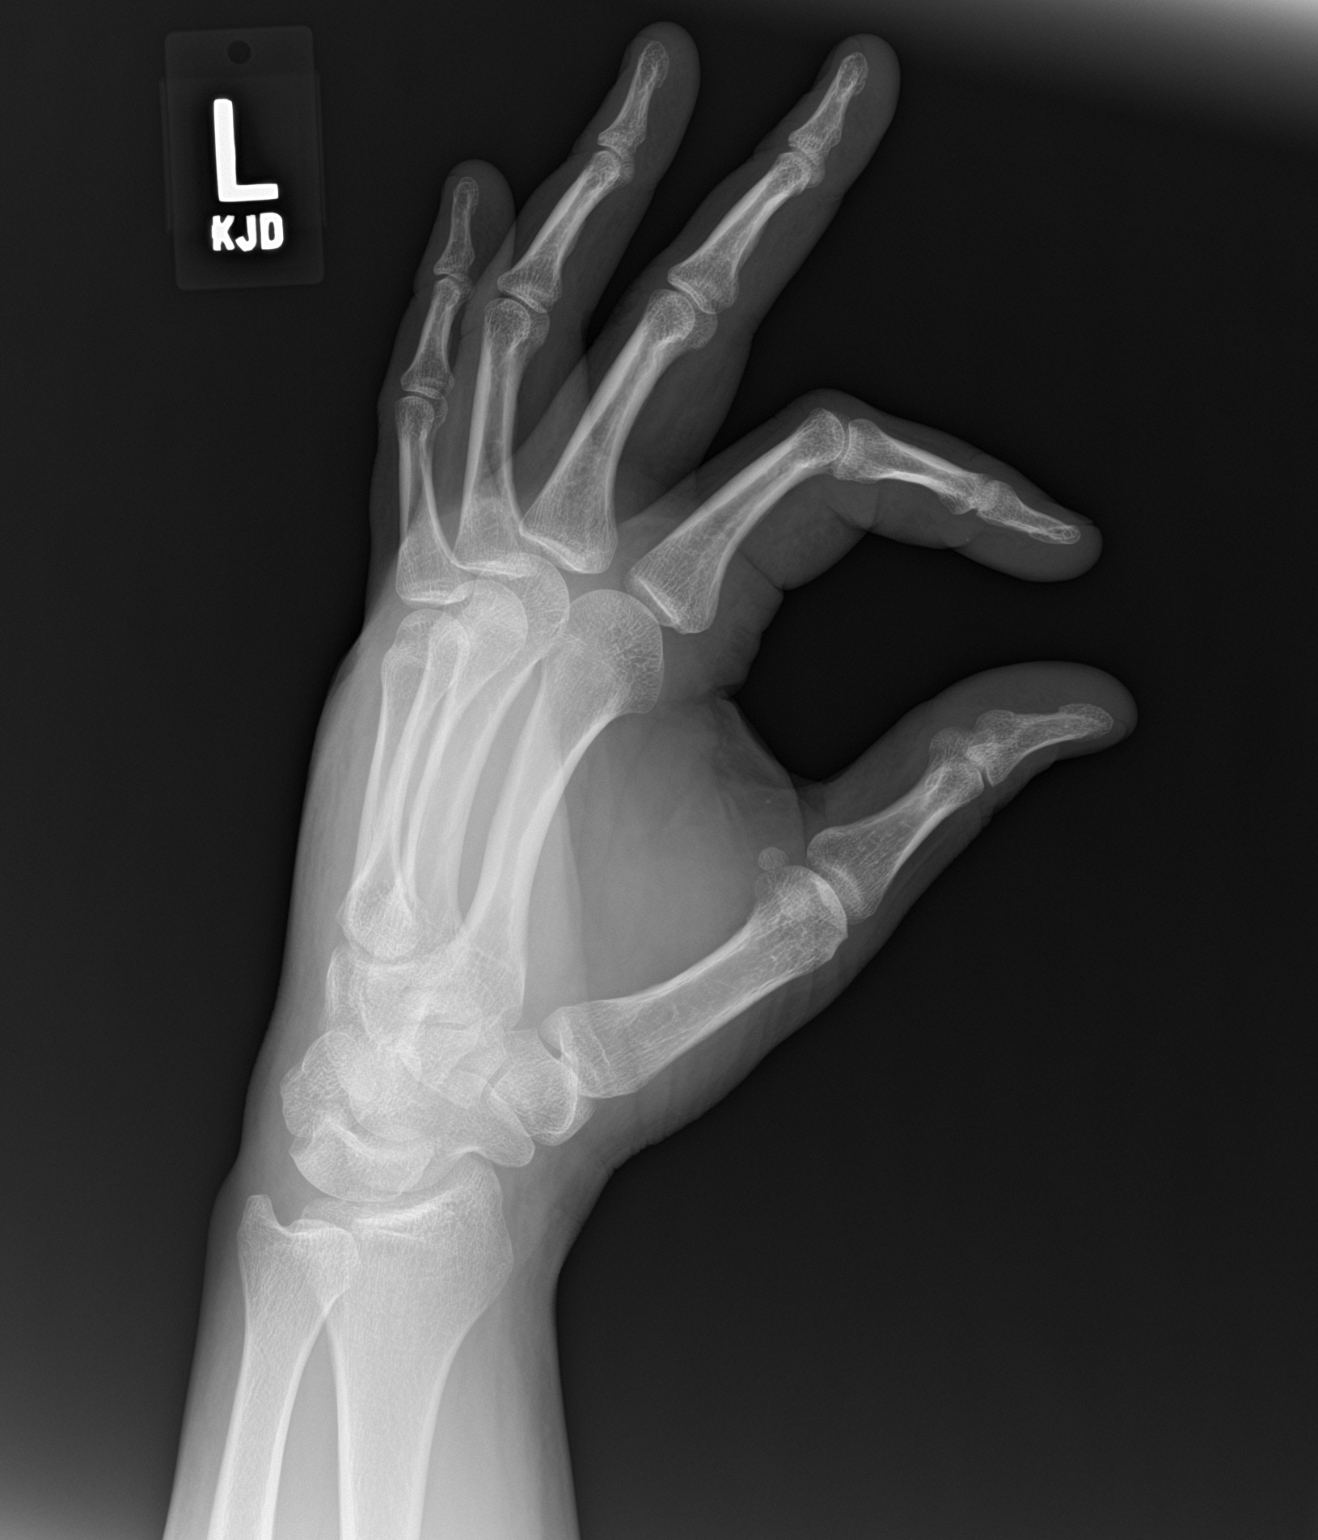

[hand lat]
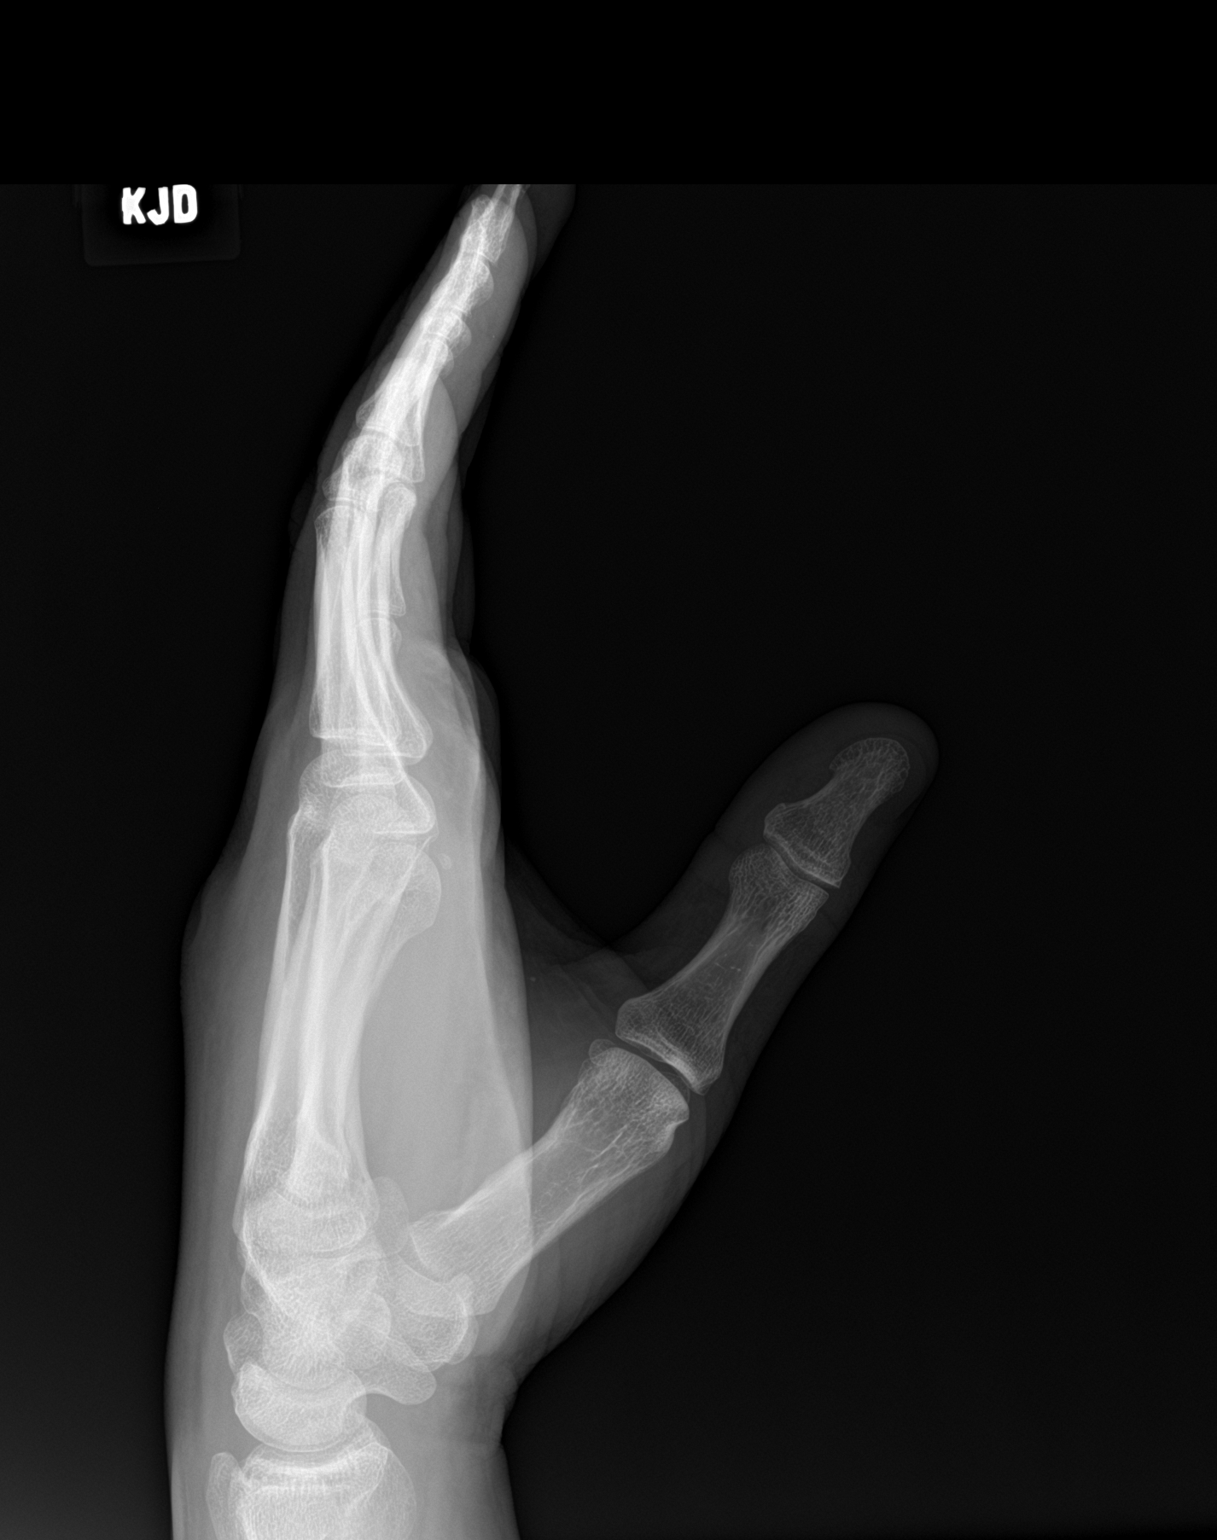

[3 of 3 positions shown; findings below may reference images not displayed]

FINDINGS: There is no evidence of fracture or dislocation. There is no
evidence of arthropathy or other focal bone abnormality. No bony
destructive change. There is dorsal soft tissue edema with skin
irregularity. No radiopaque foreign body. No soft tissue air.
IMPRESSION: Dorsal soft tissue edema. No soft tissue air or radiopaque foreign
body. No osseous abnormality to suggest osteomyelitis.

## 2017-04-26 DIAGNOSIS — L578 Other skin changes due to chronic exposure to nonionizing radiation: Secondary | ICD-10-CM | POA: Diagnosis not present

## 2017-04-26 DIAGNOSIS — D485 Neoplasm of uncertain behavior of skin: Secondary | ICD-10-CM | POA: Diagnosis not present

## 2017-04-26 DIAGNOSIS — L820 Inflamed seborrheic keratosis: Secondary | ICD-10-CM | POA: Diagnosis not present

## 2017-05-01 IMAGING — DX DG FOOT COMPLETE 3+V*R*
3 series · 3 of 3 positions shown · non-contrast
Comparison: No priors.

CLINICAL DATA: 30-year-old male with history of pain in the first
MTP joint for the past 3-4 days.

EXAM:
RIGHT FOOT COMPLETE - 3+ VIEW

[foot ap]
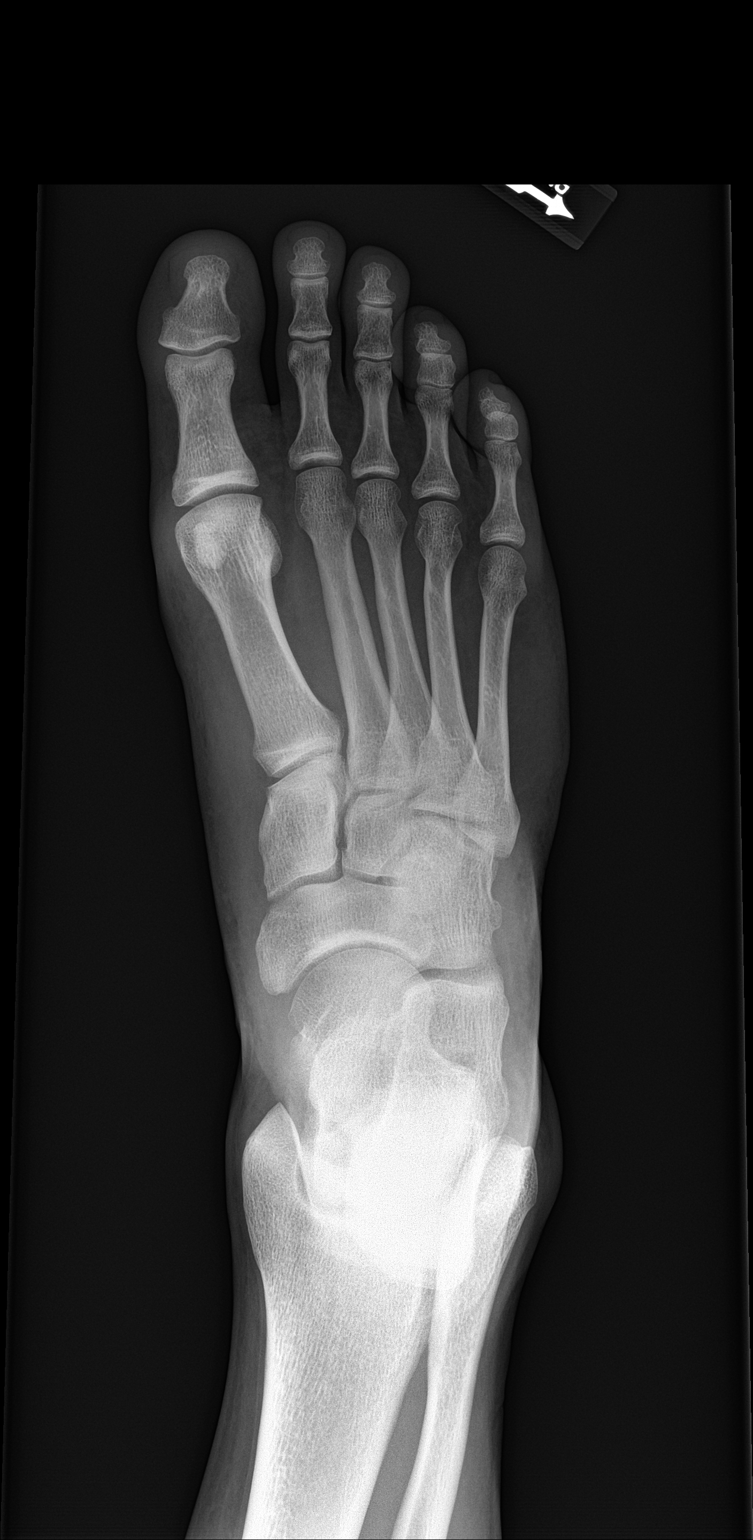

[foot obl]
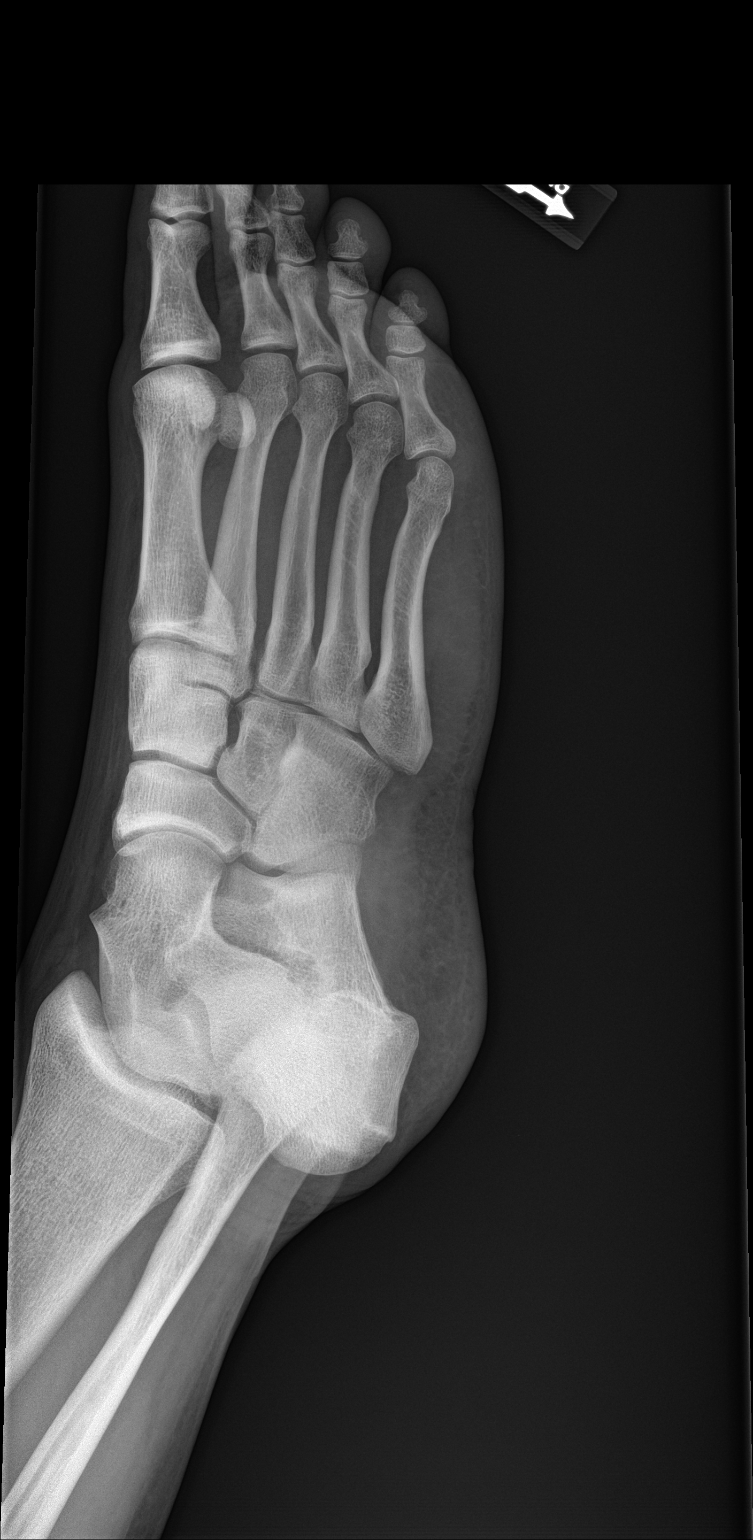

[foot lat]
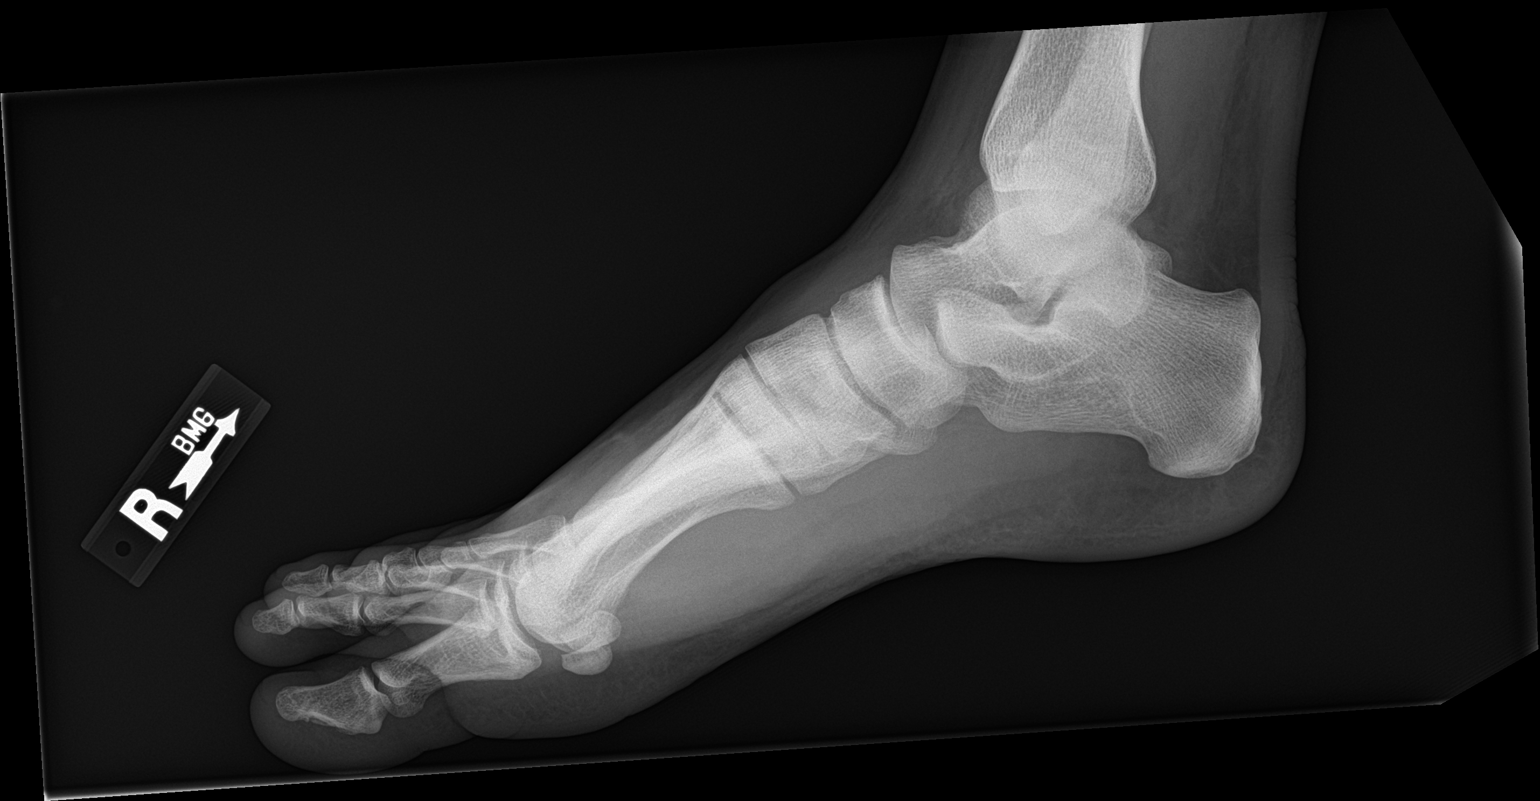

[3 of 3 positions shown; findings below may reference images not displayed]

FINDINGS: There is no evidence of fracture or dislocation. There is no
evidence of arthropathy or other focal bone abnormality. Soft
tissues are unremarkable.
IMPRESSION: Negative.

## 2017-05-09 DIAGNOSIS — D485 Neoplasm of uncertain behavior of skin: Secondary | ICD-10-CM | POA: Diagnosis not present

## 2017-11-14 ENCOUNTER — Other Ambulatory Visit: Payer: Self-pay

## 2017-11-14 ENCOUNTER — Ambulatory Visit: Payer: 59 | Admitting: Physician Assistant

## 2017-11-14 ENCOUNTER — Encounter (HOSPITAL_COMMUNITY): Payer: Self-pay

## 2017-11-14 ENCOUNTER — Encounter: Payer: Self-pay | Admitting: Physician Assistant

## 2017-11-14 ENCOUNTER — Ambulatory Visit: Payer: Self-pay

## 2017-11-14 ENCOUNTER — Ambulatory Visit (HOSPITAL_COMMUNITY)
Admission: RE | Admit: 2017-11-14 | Discharge: 2017-11-14 | Disposition: A | Discharge status: Home / Self Care | Payer: 59 | Source: Ambulatory Visit | Attending: Physician Assistant | Admitting: Physician Assistant

## 2017-11-14 ENCOUNTER — Telehealth: Payer: Self-pay | Admitting: Physician Assistant

## 2017-11-14 VITALS — BP 148/90 | HR 74 | Temp 98.6°F | Resp 16 | Ht 71.26 in | Wt 245.0 lb

## 2017-11-14 DIAGNOSIS — K529 Noninfective gastroenteritis and colitis, unspecified: Secondary | ICD-10-CM

## 2017-11-14 DIAGNOSIS — R030 Elevated blood-pressure reading, without diagnosis of hypertension: Secondary | ICD-10-CM | POA: Diagnosis not present

## 2017-11-14 DIAGNOSIS — R1032 Left lower quadrant pain: Secondary | ICD-10-CM | POA: Insufficient documentation

## 2017-11-14 LAB — POCT CBC
GRANULOCYTE PERCENT: 66.5 % (ref 37–80)
HCT, POC: 51.2 % (ref 43.5–53.7)
Hemoglobin: 17.3 g/dL (ref 14.1–18.1)
Lymph, poc: 1.7 (ref 0.6–3.4)
MCH: 30.2 pg (ref 27–31.2)
MCHC: 33.7 g/dL (ref 31.8–35.4)
MCV: 89.5 fL (ref 80–97)
MID (CBC): 1.5 — AB (ref 0–0.9)
MPV: 6.5 fL (ref 0–99.8)
POC Granulocyte: 6.3 (ref 2–6.9)
POC LYMPH PERCENT: 17.6 %L (ref 10–50)
POC MID %: 15.9 %M — AB (ref 0–12)
Platelet Count, POC: 277 10*3/uL (ref 142–424)
RBC: 5.72 M/uL (ref 4.69–6.13)
RDW, POC: 12.1 %
WBC: 9.5 10*3/uL (ref 4.6–10.2)

## 2017-11-14 LAB — CMP14+EGFR
A/G RATIO: 2.2 (ref 1.2–2.2)
ALK PHOS: 94 IU/L (ref 39–117)
ALT: 33 IU/L (ref 0–44)
AST: 22 IU/L (ref 0–40)
Albumin: 5 g/dL (ref 3.5–5.5)
BILIRUBIN TOTAL: 0.5 mg/dL (ref 0.0–1.2)
BUN/Creatinine Ratio: 12 (ref 9–20)
BUN: 12 mg/dL (ref 6–20)
CHLORIDE: 105 mmol/L (ref 96–106)
CO2: 24 mmol/L (ref 20–29)
Calcium: 9.9 mg/dL (ref 8.7–10.2)
Creatinine, Ser: 1.02 mg/dL (ref 0.76–1.27)
GFR calc Af Amer: 112 mL/min/{1.73_m2} (ref >59)
GFR calc non Af Amer: 97 mL/min/{1.73_m2} (ref >59)
GLOBULIN, TOTAL: 2.3 g/dL (ref 1.5–4.5)
Glucose: 83 mg/dL (ref 65–99)
POTASSIUM: 4 mmol/L (ref 3.5–5.2)
SODIUM: 142 mmol/L (ref 134–144)
Total Protein: 7.3 g/dL (ref 6.0–8.5)

## 2017-11-14 LAB — POCT URINALYSIS DIP (MANUAL ENTRY)
BILIRUBIN UA: NEGATIVE
Blood, UA: NEGATIVE
Glucose, UA: NEGATIVE mg/dL
Ketones, POC UA: NEGATIVE mg/dL
LEUKOCYTES UA: NEGATIVE
Nitrite, UA: NEGATIVE
PROTEIN UA: NEGATIVE mg/dL
Spec Grav, UA: 1.025 (ref 1.010–1.025)
Urobilinogen, UA: 0.2 E.U./dL
pH, UA: 6 (ref 5.0–8.0)

## 2017-11-14 MED ORDER — AMOXICILLIN-POT CLAVULANATE 875-125 MG PO TABS: 1.0000 | ORAL_TABLET | Freq: Two times a day (BID) | ORAL | 0 refills | Status: AC

## 2017-11-14 MED ORDER — IOPAMIDOL (ISOVUE-300) INJECTION 61%
INTRAVENOUS | Status: AC
  Administered 2017-11-14: 100 mL
  Filled 2017-11-14: qty 100

## 2017-11-14 NOTE — Telephone Encounter (Signed)
See if patient could come in early with Terry Curtis.  She has a 1:40 or 2:00 available at time of call.

## 2017-11-14 NOTE — Telephone Encounter (Signed)
[facility] with [facility] calling stating the pt had stat blood chemistries done that all came back within normal limits. Results will also be faxed to the office.

## 2017-11-14 NOTE — Telephone Encounter (Signed)
Patient called in with c/o "abdominal pain." He says "when I got up on Tuesday, I had some pain and I thought it was something I ate the day before. I was a little constipated, but after I had a bowel movement, it was soft. The pain is right at my belt line to the left lower side of my abdomen, not my groin. When I bend, twist, or lift something heavy it hurts worse. I've taken aleve, but it doesn't relieve the pain." I asked him to describe the pain and intensity, he said "it's feels like cramping and sharp at times. It's a moderate pain and constant since Tuesday, but gradually got worse." I asked has he been nauseated, vomited, chest pain, fever, he says "no fever, nausea or vomiting. I feel like sometimes in my chest it's like acid reflux or heartburn, otherwise no pain." According to protocol, see PCP within 24 hours, appointment made today at 1740 with Terry CoreBrittany Wiseman, PA-C, care advice given, patient verbalized understanding.  Reason for Disposition . [1] MODERATE pain (e.g., interferes with normal activities) AND [2] pain comes and goes (cramps) AND [3] present > 24 hours  (Exception: pain with Vomiting or Diarrhea - see that Guideline)  Answer Assessment - Initial Assessment Questions 1. LOCATION: "Where does it hurt?"      Lower left abdomen above belt line 2. RADIATION: "Does the pain shoot anywhere else?" (e.g., chest, back)     Shoots up the left side if I move or twist 3. ONSET: "When did the pain begin?" (Minutes, hours or days ago)     Tuesday 4. SUDDEN: "Gradual or sudden onset?"     Gradual got worse since Tuesday 5. PATTERN "Does the pain come and go, or is it constant?"    - If constant: "Is it getting better, staying the same, or worsening?"      (Note: Constant means the pain never goes away completely; most serious pain is constant and it progresses)     - If intermittent: "How long does it last?" "Do you have pain now?"     (Note: Intermittent means the pain goes away  completely between bouts)     [place] 6. SEVERITY: "How bad is the pain?"  (e.g., Scale 1-10; mild, moderate, or severe)    - MILD (1-3): doesn't interfere with normal activities, abdomen soft and not tender to touch     - MODERATE (4-7): interferes with normal activities or awakens from sleep, tender to touch     - SEVERE (8-10): excruciating pain, doubled over, unable to do any normal activities       Moderate 7. RECURRENT SYMPTOM: "Have you ever had this type of abdominal pain before?" If so, ask: "When was the last time?" and "What happened that time?"      No 8. CAUSE: "What do you think is causing the abdominal pain?"     Unknown 9. RELIEVING/AGGRAVATING FACTORS: "What makes it better or worse?" (e.g., movement, antacids, bowel movement)     Bending forward or pick up something heavy, coughing, sneezing 10. OTHER SYMPTOMS: "Has there been any vomiting, diarrhea, constipation, or urine problems?"      Sometimes feel like heartburn or acid reflux, no urine or bowel problems, no vomiting or nausea  Protocols used: ABDOMINAL PAIN - MALE-A-AH

## 2017-11-14 NOTE — Progress Notes (Signed)
Jermey Closs  MRN: [id number] DOB: [birth year]  Subjective:   Dirk Vanaman is a 32 y.o. male who presents for evaluation of abdominal pain. Onset was 2 days ago. Symptoms have been gradually worsening. The pain is described as sharp and pressure like. Notes it is a constant 5/10 but at times will be a 10/10. Pain is located in the LLQ without radiation.  Aggravating factors: activity, movement, pressure, sneezing, sitting up and standing.  Alleviating factors: none. Associated symptoms: anorexia, flatus, and overall sick feeling. The patient denies acute injury, arthralagias, chills, constipation, dysuria, fever, urinary frequency, headache, hematochezia, hematuria, melena, myalgias, nausea and vomiting. Notes he is not really having diarrhea but is having loose stools. Will have anywhere from 4-5 a day. Marland Kitchen He denies excessive alcohol or NSAID use. No PSH of abdomen surgeries. No PMH of diverticulitis or IBD.  Does have PMH of umbilical hernia, which he notes looks like it has gotten bigger the past couple days.   Review of Systems  Eyes: Negative for visual disturbance.  Respiratory: Negative for cough, shortness of breath and wheezing.   Cardiovascular: Negative for chest pain, palpitations and leg swelling.  Neurological: Negative for headaches.      There are no active problems to display for this patient.   Current Outpatient Medications on File Prior to Visit  Medication Sig Dispense Refill  . levocetirizine (XYZAL) 5 MG tablet TK 1 T PO QD IN THE EVE  3  . montelukast (SINGULAIR) 10 MG tablet TK 1 T PO QD HS  3   No current facility-administered medications on file prior to visit.     Allergies  Allergen Reactions  . Tramadol Nausea Only  . Adhesive [Tape] Rash    3X3 adhesive bandage caused rash       Social History   Socioeconomic History  . Marital status: Single    Spouse name: Not on file  . Number of children: Not on file  . Years of education: Not on  file  . Highest education level: Not on file  Social Needs  . Financial resource strain: Not on file  . Food insecurity - worry: Not on file  . Food insecurity - inability: Not on file  . Transportation needs - medical: Not on file  . Transportation needs - non-medical: Not on file  Occupational History  . Not on file  Tobacco Use  . Smoking status: Never Smoker  . Smokeless tobacco: Never Used  Substance and Sexual Activity  . Alcohol use: Yes  . Drug use: No  . Sexual activity: Not on file  Other Topics Concern  . Not on file  Social History Narrative  . Not on file    Objective:  BP (!) 148/90   Pulse 74   Temp 98.6 F (37 C) (Oral)   Resp 16   Ht 5' 11.26" (1.81 m)   Wt 245 lb (111.1 kg)   SpO2 97%   BMI 33.92 kg/m   Physical Exam  Constitutional: He is oriented to person, place, and time. He appears distressed (sitting on edge of exam table).  HENT:  Head: Normocephalic and atraumatic.  Eyes: Conjunctivae are normal.  Neck: Normal range of motion.  Cardiovascular: Normal rate, regular rhythm and normal heart sounds.  Pulmonary/Chest: Effort normal.  Abdominal: Soft. Normal appearance and bowel sounds are normal. He exhibits no distension and no mass. There is tenderness (with deep palpation of LLQ ) in the left lower quadrant. There is  guarding (with deep palpation). There is no rebound, no CVA tenderness, no tenderness at McBurney's point and negative Murphy's sign. A hernia is present. Hernia confirmed positive in the umbilical area (reducible, nontender).  Difficult abdominal exam due to TTP of LLQ   Neurological: He is alert and oriented to person, place, and time. Gait normal.  Skin: Skin is warm and dry.  Psychiatric: Affect normal.  Vitals reviewed.    Results for orders placed or performed in visit on 11/14/17 (from the past 24 hour(s))  POCT urinalysis dipstick     Status: None   Collection Time: 11/14/17  2:31 PM  Result Value Ref Range   Color,  UA yellow yellow   Clarity, UA clear clear   Glucose, UA negative negative mg/dL   Bilirubin, UA negative negative   Ketones, POC UA negative negative mg/dL   Spec Grav, UA 1.025 1.010 - 1.025   Blood, UA negative negative   pH, UA 6.0 5.0 - 8.0   Protein Ur, POC negative negative mg/dL   Urobilinogen, UA 0.2 0.2 or 1.0 E.U./dL   Nitrite, UA Negative Negative   Leukocytes, UA Negative Negative  POCT CBC     Status: Abnormal   Collection Time: 11/14/17  3:01 PM  Result Value Ref Range   WBC 9.5 4.6 - 10.2 K/uL   Lymph, poc 1.7 0.6 - 3.4   POC LYMPH PERCENT 17.6 10 - 50 %L   MID (cbc) 1.5 (A) 0 - 0.9   POC MID % 15.9 (A) 0 - 12 %M   POC Granulocyte 6.3 2 - 6.9   Granulocyte percent 66.5 37 - 80 %G   RBC 5.72 4.69 - 6.13 M/uL   Hemoglobin 17.3 14.1 - 18.1 g/dL   HCT, POC 51.2 43.5 - 53.7 %   MCV 89.5 80 - 97 fL   MCH, POC 30.2 27 - 31.2 pg   MCHC 33.7 31.8 - 35.4 g/dL   RDW, POC 12.1 %   Platelet Count, POC 277 142 - 424 K/uL   MPV 6.5 0 - 99.8 fL    Ct Abdomen Pelvis W Contrast  Result Date: 11/14/2017 CLINICAL DATA:  Left lower quadrant abdomen pain. Assess for hernia. EXAM: CT ABDOMEN AND PELVIS WITH CONTRAST TECHNIQUE: Multidetector CT imaging of the abdomen and pelvis was performed using the standard protocol following bolus administration of intravenous contrast. CONTRAST:  141m ISOVUE-300 IOPAMIDOL (ISOVUE-300) INJECTION 61% COMPARISON:  None. FINDINGS: Lower chest: No acute abnormality. Hepatobiliary: There is diffuse low density of the liver. No focal liver lesion is identified. The gallbladder and biliary tree are normal. Pancreas: Unremarkable. No pancreatic ductal dilatation or surrounding inflammatory changes. Spleen: Normal in size without focal abnormality. Adrenals/Urinary Tract: Adrenal glands are unremarkable. Kidneys are normal, without renal calculi, focal lesion, or hydronephrosis. Bladder is unremarkable. Stomach/Bowel: There is inflammation and stranding  surrounding the mid to distal descending colon. The small bowel is normal. The appendix is normal. There is a small hiatal hernia. The stomach is otherwise normal. Vascular/Lymphatic: No significant vascular findings are present. No enlarged abdominal or pelvic lymph nodes. Reproductive: Prostate gland is normal. Other: There is no evidence of inguinal hernias or ventral hernias. Musculoskeletal: No acute or significant osseous findings. IMPRESSION: Colitis of the mid to distal descending colon. No evidence of inguinal or ventral hernias. Electronically Signed   By: WAbelardo DieselM.D.   On: 11/14/2017 18:28     Assessment and Plan :  This case was precepted with Dr. SPamella Pert  1. Left lower quadrant pain Pt is clearly uncomfortable. His bp is likely elevated due to pain. Otherwise, vitals stable. LLQ abdominal exam difficult to perform due to pain. I can palpate a reducible nontender umbilical hernia. WBC and UA normal. Recommend STAT CT for further evaluation. DDx includes strangulated hernia, colitis, and less likely diverticulitis. STAT CT scheduled for today.   - POCT urinalysis dipstick - POCT CBC - CT Abdomen Pelvis W Contrast; Future - CMP14+EGFR  UPDATE:  2. Colitis presumed infectious STAT CT shows colitis of the mid to distal descending colon. No evidence of inguinal or ventral hernias. Will treat for infectious etiology at this time. Pt contacted with lab results and encouraged to take abx as prescribed and consume a bland soft diet. Plan to return to office in 2 days for reevaluation. Will obtain CBC, CRP, and sed rate at this visit and inquire more about potential underlying IBD. Pt given strict ED precautions. He voices his understanding.  - amoxicillin-clavulanate (AUGMENTIN) 875-125 MG tablet; Take 1 tablet by mouth 2 (two) times daily for 7 days.  Dispense: 20 tablet; Refill: 0  3. Elevated blood pressure reading Likely due to pain. Will recheck at follow up visit. Given strict  ED precautions.    Tenna Delaine PA-C  [facility] 11/14/2017 6:48 PM

## 2017-11-14 NOTE — Patient Instructions (Addendum)
Please go to [facility] immediately for your STAT CT exam today. You will be there for two hours before your exam to drink the contrast fluid.   [facility] is located at [address], [place], [place] [id number].     IF you received an x-ray today, you will receive an invoice from [facility]. Please contact [facility] at [phone] with questions or concerns regarding your invoice.   IF you received labwork today, you will receive an invoice from [facility]. Please contact [facility] at [phone] with questions or concerns regarding your invoice.   Our billing staff will not be able to assist you with questions regarding bills from these companies.  You will be contacted with the lab results as soon as they are available. The fastest way to get your results is to activate your My Chart account. Instructions are located on the last page of this paperwork. If you have not heard from us regarding the results in 2 weeks, please contact this office.

## 2017-11-15 ENCOUNTER — Ambulatory Visit (HOSPITAL_COMMUNITY): Payer: 59

## 2017-11-15 ENCOUNTER — Encounter: Payer: Self-pay | Admitting: Physician Assistant

## 2017-11-18 ENCOUNTER — Encounter: Payer: Self-pay | Admitting: Physician Assistant

## 2017-11-18 ENCOUNTER — Ambulatory Visit: Payer: 59 | Admitting: Physician Assistant

## 2017-11-18 ENCOUNTER — Other Ambulatory Visit: Payer: Self-pay

## 2017-11-18 VITALS — BP 145/98 | HR 96 | Temp 98.8°F | Resp 18 | Ht 70.16 in | Wt 240.4 lb

## 2017-11-18 DIAGNOSIS — R194 Change in bowel habit: Secondary | ICD-10-CM

## 2017-11-18 DIAGNOSIS — K529 Noninfective gastroenteritis and colitis, unspecified: Secondary | ICD-10-CM

## 2017-11-18 DIAGNOSIS — R030 Elevated blood-pressure reading, without diagnosis of hypertension: Secondary | ICD-10-CM | POA: Diagnosis not present

## 2017-11-18 DIAGNOSIS — K429 Umbilical hernia without obstruction or gangrene: Secondary | ICD-10-CM | POA: Diagnosis not present

## 2017-11-18 NOTE — Patient Instructions (Addendum)
In terms of bowel issues, we have collected lab work today.  We should have the results back in a few days and will contact you with these results.  In the meantime, complete your antibiotic.  Also recommend continue with a light diet.  In terms of hernia, place referral to general surgery.  Also place referral to GI for further evaluation of your bowel issues.  You should be contacted to have both of these within the next 1-2 weeks.  In terms of elevated blood pressure, I would like you to check your blood pressure at least a couple times over the next week outside of the office and document these values. It is best if you check the blood pressure at different times in the day. Your goal is <140/90. If your values are consistently above this goal, please return to office for further evaluation. If you start to have chest pain, blurred vision, shortness of breath, severe headache, lower leg swelling, or nausea/vomiting please seek care immediately here or at the ED.    DASH Eating Plan DASH stands for "Dietary Approaches to Stop Hypertension." The DASH eating plan is a healthy eating plan that has been shown to reduce high blood pressure (hypertension). It may also reduce your risk for type 2 diabetes, heart disease, and stroke. The DASH eating plan may also help with weight loss. What are tips for following this plan? General guidelines  Avoid eating more than 2,300 mg (milligrams) of salt (sodium) a day. If you have hypertension, you may need to reduce your sodium intake to 1,500 mg a day.  Limit alcohol intake to no more than 1 drink a day for nonpregnant women and 2 drinks a day for men. One drink equals 12 oz of beer, 5 oz of wine, or 1 oz of hard liquor.  Work with your health care provider to maintain a healthy body weight or to lose weight. Ask what an ideal weight is for you.  Get at least 30 minutes of exercise that causes your heart to beat faster (aerobic exercise) most days of the week.  Activities may include walking, swimming, or biking.  Work with your health care provider or diet and nutrition specialist (dietitian) to adjust your eating plan to your individual calorie needs. Reading food labels  Check food labels for the amount of sodium per serving. Choose foods with less than 5 percent of the Daily Value of sodium. Generally, foods with less than 300 mg of sodium per serving fit into this eating plan.  To find whole grains, look for the word "whole" as the first word in the ingredient list. Shopping  Buy products labeled as "low-sodium" or "no salt added."  Buy fresh foods. Avoid canned foods and premade or frozen meals. Cooking  Avoid adding salt when cooking. Use salt-free seasonings or herbs instead of table salt or sea salt. Check with your health care provider or pharmacist before using salt substitutes.  Do not fry foods. Cook foods using healthy methods such as baking, boiling, grilling, and broiling instead.  Cook with heart-healthy oils, such as olive, canola, soybean, or sunflower oil. Meal planning   Eat a balanced diet that includes: ? 5 or more servings of fruits and vegetables each day. At each meal, try to fill half of your plate with fruits and vegetables. ? Up to 6-8 servings of whole grains each day. ? Less than 6 oz of lean meat, poultry, or fish each day. A 3-oz serving of meat is  about the same size as a deck of cards. One egg equals 1 oz. ? 2 servings of low-fat dairy each day. ? A serving of nuts, seeds, or beans 5 times each week. ? Heart-healthy fats. Healthy fats called Omega-3 fatty acids are found in foods such as flaxseeds and coldwater fish, like sardines, salmon, and mackerel.  Limit how much you eat of the following: ? Canned or prepackaged foods. ? Food that is high in trans fat, such as fried foods. ? Food that is high in saturated fat, such as fatty meat. ? Sweets, desserts, sugary drinks, and other foods with added  sugar. ? Full-fat dairy products.  Do not salt foods before eating.  Try to eat at least 2 vegetarian meals each week.  Eat more home-cooked food and less restaurant, buffet, and fast food.  When eating at a restaurant, ask that your food be prepared with less salt or no salt, if possible. What foods are recommended? The items listed may not be a complete list. Talk with your dietitian about what dietary choices are best for you. Grains Whole-grain or whole-wheat bread. Whole-grain or whole-wheat pasta. Brown rice. Orpah Cobbatmeal. Quinoa. Bulgur. Whole-grain and low-sodium cereals. Pita bread. Low-fat, low-sodium crackers. Whole-wheat flour tortillas. Vegetables Fresh or frozen vegetables (raw, steamed, roasted, or grilled). Low-sodium or reduced-sodium tomato and vegetable juice. Low-sodium or reduced-sodium tomato sauce and tomato paste. Low-sodium or reduced-sodium canned vegetables. Fruits All fresh, dried, or frozen fruit. Canned fruit in natural juice (without added sugar). Meat and other protein foods Skinless chicken or Malaw[place]urkey. Ground chicken or Mal[place]urkey. Pork with fat trimmed off. Fish and seafood. Egg whites. Dried beans, peas, or lentils. Unsalted nuts, nut butters, and seeds. Unsalted canned beans. Lean cuts of beef with fat trimmed off. Low-sodium, lean deli meat. Dairy Low-fat (1%) or fat-free (skim) milk. Fat-free, low-fat, or reduced-fat cheeses. Nonfat, low-sodium ricotta or cottage cheese. Low-fat or nonfat yogurt. Low-fat, low-sodium cheese. Fats and oils Soft margarine without trans fats. Vegetable oil. Low-fat, reduced-fat, or light mayonnaise and salad dressings (reduced-sodium). Canola, safflower, olive, soybean, and sunflower oils. Avocado. Seasoning and other foods Herbs. Spices. Seasoning mixes without salt. Unsalted popcorn and pretzels. Fat-free sweets. What foods are not recommended? The items listed may not be a complete list. Talk with your dietitian about what  dietary choices are best for you. Grains Baked goods made with fat, such as croissants, muffins, or some breads. Dry pasta or rice meal packs. Vegetables Creamed or fried vegetables. Vegetables in a cheese sauce. Regular canned vegetables (not low-sodium or reduced-sodium). Regular canned tomato sauce and paste (not low-sodium or reduced-sodium). Regular tomato and vegetable juice (not low-sodium or reduced-sodium). Rosita FirePickles. Olives. Fruits Canned fruit in a light or heavy syrup. Fried fruit. Fruit in cream or butter sauce. Meat and other protein foods Fatty cuts of meat. Ribs. Fried meat. Tomasa BlaseBacon. Sausage. Bologna and other processed lunch meats. Salami. Fatback. Hotdogs. Bratwurst. Salted nuts and seeds. Canned beans with added salt. Canned or smoked fish. Whole eggs or egg yolks. Chicken or Malaw[place]urkey with skin. Dairy Whole or 2% milk, cream, and half-and-half. Whole or full-fat cream cheese. Whole-fat or sweetened yogurt. Full-fat cheese. Nondairy creamers. Whipped toppings. Processed cheese and cheese spreads. Fats and oils Butter. Stick margarine. Lard. Shortening. Ghee. Bacon fat. Tropical oils, such as coconut, palm kernel, or palm oil. Seasoning and other foods Salted popcorn and pretzels. Onion salt, garlic salt, seasoned salt, table salt, and sea salt. Worcestershire sauce. Tartar sauce. Barbecue sauce. Teriyaki sauce. Soy  sauce, including reduced-sodium. Steak sauce. Canned and packaged gravies. Fish sauce. Oyster sauce. Cocktail sauce. Horseradish that you find on the shelf. Ketchup. Mustard. Meat flavorings and tenderizers. Bouillon cubes. Hot sauce and Tabasco sauce. Premade or packaged marinades. Premade or packaged taco seasonings. Relishes. Regular salad dressings. Where to find more information:  National Heart, Lung, and Blood Institute: PopSteam.is  American Heart Association: www.heart.org Summary  The DASH eating plan is a healthy eating plan that has been shown to reduce  high blood pressure (hypertension). It may also reduce your risk for type 2 diabetes, heart disease, and stroke.  With the DASH eating plan, you should limit salt (sodium) intake to 2,300 mg a day. If you have hypertension, you may need to reduce your sodium intake to 1,500 mg a day.  When on the DASH eating plan, aim to eat more fresh fruits and vegetables, whole grains, lean proteins, low-fat dairy, and heart-healthy fats.  Work with your health care provider or diet and nutrition specialist (dietitian) to adjust your eating plan to your individual calorie needs. This information is not intended to replace advice given to you by your health care provider. Make sure you discuss any questions you have with your health care provider. Document Released: 09/13/2011 Document Revised: 09/17/2016 Document Reviewed: 09/17/2016 Elsevier Interactive Patient Education  2018 [employer].    IF you received an x-ray today, you will receive an invoice from [facility]. Please contact [facility] at [phone] with questions or concerns regarding your invoice.   IF you received labwork today, you will receive an invoice from [facility]. Please contact [facility] at [phone] with questions or concerns regarding your invoice.   Our billing staff will not be able to assist you with questions regarding bills from these companies.  You will be contacted with the lab results as soon as they are available. The fastest way to get your results is to activate your My Chart account. Instructions are located on the last page of this paperwork. If you have not heard from [place] regarding the results in 2 weeks, please contact this office.

## 2017-11-18 NOTE — Progress Notes (Signed)
Terry Curtis  MRN: [id number] DOB: 1986/07/06  Subjective:  Terry Curtis is a 32 y.o. male seen in office today for a chief complaint of follow up on colitis. He was initally seen on 11/14/17 for left lower quadrant pain.  Patient is exquisitely tender to palpation in LLQ.  Sent for stat CT of the abdomen pelvis, which showed colitis of the mid to distal descending colon.  He was treated with Augmentin and told to return for follow-up.  Today, he feels about 70% better.In terms of bowel movements, he has 7-8 BMs a day for as long as he can remember. They are rated at [facility] stool type 4-5. Will have abdominal cramping, bloating, and tenesmus occasionally. Denies bloody, mucopurulent stools, constipation, fever, chills, nausea, and vomiting. Has PMH of hemorrhoids and occasional GERD. No personal hx of IBD. No FH of IBD, thyroid disease, MI, or HTN.  Denies recent travel. Denies smoking. Has tried probiotics and tums, which does help. Has never been evaluated by GI.  Patient would also like to see a general surgeon for his umbilical hernia.  He notes that is gotten progressively bigger over the past few months and it is sometimes painful.    Review of Systems  Constitutional: Negative for chills and fever.  Eyes: Negative for visual disturbance.  Respiratory: Negative for cough and shortness of breath.   Cardiovascular: Negative for chest pain, palpitations and leg swelling.  Genitourinary: Negative for hematuria.  Neurological: Negative for dizziness and light-headedness.  Psychiatric/Behavioral: The patient is nervous/anxious (occassionally).       There are no active problems to display for this patient.   Current Outpatient Medications on File Prior to Visit  Medication Sig Dispense Refill  . amoxicillin-clavulanate (AUGMENTIN) 875-125 MG tablet Take 1 tablet by mouth 2 (two) times daily for 7 days. 20 tablet 0  . levocetirizine (XYZAL) 5 MG tablet TK 1 T PO QD IN THE EVE  3   . montelukast (SINGULAIR) 10 MG tablet TK 1 T PO QD HS  3   No current facility-administered medications on file prior to visit.     Allergies  Allergen Reactions  . Tramadol Nausea Only  . Adhesive [Tape] Rash    3X3 adhesive bandage caused rash      Social History   Socioeconomic History  . Marital status: Single    Spouse name: Lyndsey  . Number of children: 0  . Years of education: Not on file  . Highest education level: Not on file  Social Needs  . Financial resource strain: Not on file  . Food insecurity - worry: Not on file  . Food insecurity - inability: Not on file  . Transportation needs - medical: Not on file  . Transportation needs - non-medical: Not on file  Occupational History  . Not on file  Tobacco Use  . Smoking status: Never Smoker  . Smokeless tobacco: Never Used  Substance and Sexual Activity  . Alcohol use: Yes  . Drug use: No  . Sexual activity: Yes  Other Topics Concern  . Not on file  Social History Narrative  . Not on file    Objective:  BP (!) 145/98 (BP Location: Left Arm, Patient Position: Sitting, Cuff Size: Large)   Pulse 96   Temp 98.8 F (37.1 C) (Oral)   Resp 18   Ht 5' 10.16" (1.782 m)   Wt 240 lb 6.4 oz (109 kg)   SpO2 97%   BMI 34.34 kg/m  Physical Exam  Constitutional: He is oriented to person, place, and time and well-developed, well-nourished, and in no distress.  HENT:  Head: Normocephalic and atraumatic.  Eyes: Conjunctivae are normal.  Neck: Normal range of motion.  Cardiovascular: Normal rate, regular rhythm and normal heart sounds.  Pulmonary/Chest: Effort normal and breath sounds normal. He has no wheezes. He has no rhonchi. He has no rales.  Abdominal: Soft. Normal appearance and bowel sounds are normal. There is tenderness (mild tenderness in LLQ). There is no rigidity, no guarding, no CVA tenderness, no tenderness at McBurney's point and negative Murphy's sign. A hernia is present. Hernia confirmed  positive in the umbilical area ( reducible, nontender).  Neurological: He is alert and oriented to person, place, and time. Gait normal.  Skin: Skin is warm and dry.  Psychiatric: Affect normal.  Vitals reviewed.    BP Readings from Last 3 Encounters:  11/18/17 (!) 145/98  11/14/17 (!) 148/90  10/04/16 (!) 128/92     Assessment and Plan :  1. Colitis Patient appears like he feels much better compared to his initial visit.  He is still mildly tender on exam.  Vital stable.  Recommended he complete antibiotic course.  Patient has long-standing history of bowel issues.  Concern for underlying IBD.  Labs pending. - CBC with Differential/Platelet - CMP14+EGFR - Sedimentation Rate - C-reactive protein - TSH - GI Profile, Stool, PCR - Ambulatory referral to Gastroenterology  2. Frequent bowel movements - CMP14+EGFR - Sedimentation Rate - C-reactive protein - TSH - GI Profile, Stool, PCR - Ambulatory referral to Gastroenterology  3. Umbilical hernia without obstruction and without gangrene Hernia is nontender and reducible on exam.  Referral to general surgery placed. - Ambulatory referral to General Surgery  4. Elevated blood pressure reading Asymptomatic.  Patient's BP was elevated at last visit as well but he was in a significant amount of pain at that visit.  He has not checked his blood pressure outside the office.  Discussed lifestyle modifications that can help lower blood pressure.  Given educational on DASH diet.. Instructed to check bp outside of office over the next couple of weeks and document these values. Return in 2 weeks for reevaluation. Given strict ED precautions.   Tenna Delaine PA-C  [facility] 11/18/2017 6:55 PM

## 2017-11-19 LAB — CMP14+EGFR
ALT: 32 IU/L (ref 0–44)
AST: 21 IU/L (ref 0–40)
Albumin/Globulin Ratio: 2.3 — ABNORMAL HIGH (ref 1.2–2.2)
Albumin: 5.1 g/dL (ref 3.5–5.5)
Alkaline Phosphatase: 95 IU/L (ref 39–117)
BUN/Creatinine Ratio: 12 (ref 9–20)
BUN: 12 mg/dL (ref 6–20)
Bilirubin Total: 0.3 mg/dL (ref 0.0–1.2)
CALCIUM: 9.8 mg/dL (ref 8.7–10.2)
CO2: 19 mmol/L — AB (ref 20–29)
CREATININE: 0.97 mg/dL (ref 0.76–1.27)
Chloride: 106 mmol/L (ref 96–106)
GFR calc Af Amer: 119 mL/min/{1.73_m2} (ref >59)
GFR, EST NON AFRICAN AMERICAN: 103 mL/min/{1.73_m2} (ref >59)
GLOBULIN, TOTAL: 2.2 g/dL (ref 1.5–4.5)
Glucose: 91 mg/dL (ref 65–99)
Potassium: 4.1 mmol/L (ref 3.5–5.2)
SODIUM: 144 mmol/L (ref 134–144)
TOTAL PROTEIN: 7.3 g/dL (ref 6.0–8.5)

## 2017-11-19 LAB — CBC WITH DIFFERENTIAL/PLATELET
Basophils Absolute: 0 10*3/uL (ref 0.0–0.2)
Basos: 0 %
EOS (ABSOLUTE): 0.2 10*3/uL (ref 0.0–0.4)
EOS: 2 %
HEMATOCRIT: 48.6 % (ref 37.5–51.0)
Hemoglobin: 17.5 g/dL (ref 13.0–17.7)
IMMATURE GRANS (ABS): 0 10*3/uL (ref 0.0–0.1)
IMMATURE GRANULOCYTES: 0 %
LYMPHS: 33 %
Lymphocytes Absolute: 3.2 10*3/uL — ABNORMAL HIGH (ref 0.7–3.1)
MCH: 31.7 pg (ref 26.6–33.0)
MCHC: 36 g/dL — ABNORMAL HIGH (ref 31.5–35.7)
MCV: 88 fL (ref 79–97)
MONOS ABS: 1 10*3/uL — AB (ref 0.1–0.9)
Monocytes: 10 %
NEUTROS PCT: 55 %
Neutrophils Absolute: 5.4 10*3/uL (ref 1.4–7.0)
Platelets: 256 10*3/uL (ref 150–379)
RBC: 5.52 x10E6/uL (ref 4.14–5.80)
RDW: 13.1 % (ref 12.3–15.4)
WBC: 9.8 10*3/uL (ref 3.4–10.8)

## 2017-11-19 LAB — C-REACTIVE PROTEIN: CRP: 2.3 mg/L (ref 0.0–4.9)

## 2017-11-19 LAB — TSH: TSH: 3.21 u[IU]/mL (ref 0.450–4.500)

## 2017-11-19 LAB — SEDIMENTATION RATE: Sed Rate: 13 mm/hr (ref 0–15)

## 2017-11-20 LAB — GI PROFILE, STOOL, PCR
ADENOVIRUS F 40/41: NOT DETECTED
Astrovirus: NOT DETECTED
C difficile toxin A/B: NOT DETECTED
CYCLOSPORA CAYETANENSIS: NOT DETECTED
Campylobacter: NOT DETECTED
Cryptosporidium: NOT DETECTED
ENTAMOEBA HISTOLYTICA: NOT DETECTED
ENTEROAGGREGATIVE E COLI: NOT DETECTED
ENTEROTOXIGENIC E COLI: NOT DETECTED
Enteropathogenic E coli: NOT DETECTED
GIARDIA LAMBLIA: NOT DETECTED
NOROVIRUS GI/GII: NOT DETECTED
Plesiomonas shigelloides: NOT DETECTED
Rotavirus A: NOT DETECTED
SALMONELLA: NOT DETECTED
Sapovirus: NOT DETECTED
Shiga-toxin-producing E coli: NOT DETECTED
Shigella/Enteroinvasive E coli: NOT DETECTED
VIBRIO CHOLERAE: NOT DETECTED
VIBRIO: NOT DETECTED
YERSINIA ENTEROCOLITICA: NOT DETECTED

## 2018-10-21 DIAGNOSIS — J111 Influenza due to unidentified influenza virus with other respiratory manifestations: Secondary | ICD-10-CM | POA: Diagnosis not present

## 2018-11-11 DIAGNOSIS — J189 Pneumonia, unspecified organism: Secondary | ICD-10-CM | POA: Diagnosis not present

## 2018-12-19 DIAGNOSIS — F411 Generalized anxiety disorder: Secondary | ICD-10-CM | POA: Diagnosis not present

## 2018-12-19 DIAGNOSIS — J309 Allergic rhinitis, unspecified: Secondary | ICD-10-CM | POA: Diagnosis not present

## 2018-12-19 DIAGNOSIS — R030 Elevated blood-pressure reading, without diagnosis of hypertension: Secondary | ICD-10-CM | POA: Diagnosis not present

## 2019-03-12 DIAGNOSIS — U071 COVID-19: Secondary | ICD-10-CM | POA: Diagnosis not present

## 2019-03-12 DIAGNOSIS — Z20828 Contact with and (suspected) exposure to other viral communicable diseases: Secondary | ICD-10-CM | POA: Diagnosis not present

## 2019-03-19 DIAGNOSIS — I10 Essential (primary) hypertension: Secondary | ICD-10-CM | POA: Diagnosis not present

## 2019-03-19 DIAGNOSIS — Z23 Encounter for immunization: Secondary | ICD-10-CM | POA: Diagnosis not present

## 2019-03-19 DIAGNOSIS — Z0000 Encounter for general adult medical examination without abnormal findings: Secondary | ICD-10-CM | POA: Diagnosis not present

## 2019-03-19 DIAGNOSIS — R5383 Other fatigue: Secondary | ICD-10-CM | POA: Diagnosis not present

## 2019-03-19 DIAGNOSIS — Z6837 Body mass index (BMI) 37.0-37.9, adult: Secondary | ICD-10-CM | POA: Diagnosis not present

## 2019-05-01 DIAGNOSIS — D225 Melanocytic nevi of trunk: Secondary | ICD-10-CM | POA: Diagnosis not present

## 2019-05-01 DIAGNOSIS — Z1283 Encounter for screening for malignant neoplasm of skin: Secondary | ICD-10-CM | POA: Diagnosis not present
